# Patient Record
Sex: Male | Born: 2000 | Race: Black or African American | Hispanic: No | Marital: Single | Smoking: Never smoker
Health system: Southern US, Community
[De-identification: ages and names within clinical notes are randomized; demographics above are authoritative.]

## PROBLEM LIST (undated history)

## (undated) DIAGNOSIS — G35 Multiple sclerosis: Secondary | ICD-10-CM

## (undated) DIAGNOSIS — J45909 Unspecified asthma, uncomplicated: Secondary | ICD-10-CM

---
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
## 2021-05-24 ENCOUNTER — Emergency Department (HOSPITAL_COMMUNITY)
Admission: EM | Admit: 2021-05-24 | Discharge: 2021-05-25 | Discharge status: Against Medical Advice | Payer: Medicaid Other | Attending: Emergency Medicine | Admitting: Emergency Medicine

## 2021-05-24 ENCOUNTER — Other Ambulatory Visit: Payer: Self-pay

## 2021-05-24 DIAGNOSIS — S3992XA Unspecified injury of lower back, initial encounter: Secondary | ICD-10-CM | POA: Diagnosis present

## 2021-05-24 DIAGNOSIS — S39012A Strain of muscle, fascia and tendon of lower back, initial encounter: Secondary | ICD-10-CM | POA: Insufficient documentation

## 2021-05-24 DIAGNOSIS — Y92410 Unspecified street and highway as the place of occurrence of the external cause: Secondary | ICD-10-CM | POA: Insufficient documentation
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
## 2021-05-25 ENCOUNTER — Emergency Department (HOSPITAL_COMMUNITY)
Admission: EM | Admit: 2021-05-25 | Discharge: 2021-05-25 | Disposition: A | Discharge status: Home / Self Care | Payer: Medicaid Other | Source: Home / Self Care | Attending: Emergency Medicine | Admitting: Emergency Medicine

## 2021-05-25 ENCOUNTER — Emergency Department (HOSPITAL_COMMUNITY): Payer: Medicaid Other

## 2021-05-25 DIAGNOSIS — Y92410 Unspecified street and highway as the place of occurrence of the external cause: Secondary | ICD-10-CM | POA: Insufficient documentation

## 2021-05-25 DIAGNOSIS — S39012A Strain of muscle, fascia and tendon of lower back, initial encounter: Secondary | ICD-10-CM

## 2021-05-25 MED ORDER — NAPROXEN 375 MG PO TABS: 375.0000 mg | ORAL_TABLET | Freq: Two times a day (BID) | ORAL | 0 refills | Status: DC

## 2021-05-25 MED ORDER — CYCLOBENZAPRINE HCL 10 MG PO TABS: 10.0000 mg | ORAL_TABLET | Freq: Two times a day (BID) | ORAL | 0 refills | Status: DC | PRN

## 2021-05-25 NOTE — ED Notes (Signed)
Pt name called with no answer 

## 2021-05-25 NOTE — Discharge Instructions (Addendum)
Take the medications as needed for pain and discomfort.  Your symptoms should improve over the next week.  Follow-up with your primary care doctor the symptoms do not resolve in the next couple of weeks

## 2021-05-25 NOTE — ED Notes (Signed)
Patient ambulatory to lobby. Discharge paperwork reviewed, patient denies any needs or questions at this time

## 2021-05-25 NOTE — ED Notes (Signed)
Called for repeat VS x3, no response. 
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
## 2021-06-17 ENCOUNTER — Emergency Department (HOSPITAL_COMMUNITY)
Admission: EM | Admit: 2021-06-17 | Discharge: 2021-06-17 | Disposition: A | Discharge status: Home / Self Care | Payer: Medicaid Other | Source: Home / Self Care | Attending: Emergency Medicine | Admitting: Emergency Medicine

## 2021-06-17 ENCOUNTER — Emergency Department (HOSPITAL_COMMUNITY): Payer: Medicaid Other

## 2021-06-17 ENCOUNTER — Encounter (HOSPITAL_COMMUNITY): Payer: Self-pay | Admitting: Emergency Medicine

## 2021-06-17 ENCOUNTER — Other Ambulatory Visit: Payer: Self-pay

## 2021-06-17 ENCOUNTER — Emergency Department (HOSPITAL_COMMUNITY)
Admission: EM | Admit: 2021-06-17 | Discharge: 2021-06-17 | Disposition: A | Discharge status: Home / Self Care | Payer: Medicaid Other | Attending: Emergency Medicine | Admitting: Emergency Medicine

## 2021-06-17 DIAGNOSIS — M79622 Pain in left upper arm: Secondary | ICD-10-CM | POA: Insufficient documentation

## 2021-06-17 DIAGNOSIS — R202 Paresthesia of skin: Secondary | ICD-10-CM | POA: Insufficient documentation

## 2021-06-17 DIAGNOSIS — R200 Anesthesia of skin: Secondary | ICD-10-CM | POA: Diagnosis not present

## 2021-06-17 DIAGNOSIS — J45909 Unspecified asthma, uncomplicated: Secondary | ICD-10-CM | POA: Insufficient documentation

## 2021-06-17 DIAGNOSIS — M79609 Pain in unspecified limb: Secondary | ICD-10-CM

## 2021-06-17 HISTORY — DX: Unspecified asthma, uncomplicated: J45.909

## 2021-06-17 LAB — CBC WITH DIFFERENTIAL/PLATELET
Abs Immature Granulocytes: 0.01 10*3/uL (ref 0.00–0.07)
Basophils Absolute: 0 10*3/uL (ref 0.0–0.1)
Basophils Relative: 1 %
Eosinophils Absolute: 0.1 10*3/uL (ref 0.0–0.5)
Eosinophils Relative: 3 %
HCT: 41.2 % (ref 39.0–52.0)
Hemoglobin: 14.4 g/dL (ref 13.0–17.0)
Immature Granulocytes: 0 %
Lymphocytes Relative: 44 %
Lymphs Abs: 2.1 10*3/uL (ref 0.7–4.0)
MCH: 31.5 pg (ref 26.0–34.0)
MCHC: 35 g/dL (ref 30.0–36.0)
MCV: 90.2 fL (ref 80.0–100.0)
Monocytes Absolute: 0.4 10*3/uL (ref 0.1–1.0)
Monocytes Relative: 8 %
Neutro Abs: 2 10*3/uL (ref 1.7–7.7)
Neutrophils Relative %: 44 %
Platelets: 366 10*3/uL (ref 150–400)
RBC: 4.57 MIL/uL (ref 4.22–5.81)
RDW: 11.8 % (ref 11.5–15.5)
WBC: 4.7 10*3/uL (ref 4.0–10.5)
nRBC: 0 % (ref 0.0–0.2)

## 2021-06-17 LAB — BASIC METABOLIC PANEL
Anion gap: 8 (ref 5–15)
BUN: 10 mg/dL (ref 6–20)
CO2: 24 mmol/L (ref 22–32)
Calcium: 9.3 mg/dL (ref 8.9–10.3)
Chloride: 104 mmol/L (ref 98–111)
Creatinine, Ser: 1.16 mg/dL (ref 0.61–1.24)
GFR, Estimated: >60 mL/min (ref >60)
Glucose, Bld: 105 mg/dL — ABNORMAL HIGH (ref 70–99)
Potassium: 4 mmol/L (ref 3.5–5.1)
Sodium: 136 mmol/L (ref 135–145)
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
## 2021-06-17 NOTE — ED Notes (Signed)
Mri has called asking if the pt is a and o  I went in to see if the pt was awake .  The pt was standing beside the bed with his gow off acting like he was disoriented from sleep  He has removed the pulse ox and his bp cuff and had thrown these items in the floor

## 2021-06-17 NOTE — ED Notes (Signed)
Patient transported to CT 

## 2021-06-17 NOTE — ED Triage Notes (Signed)
T reports left sided weakness on Monday, tinging and numb.  Left arm is weaker and does have a drift.    Provider in traige

## 2021-06-17 NOTE — ED Notes (Signed)
I went into pt's room to check on pt and he was gone and all of his personal belongings were gone.
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
## 2021-06-17 NOTE — ED Notes (Addendum)
Pt asleep not waking up for me to talk with him.  See the triage note

## 2021-06-17 NOTE — ED Triage Notes (Signed)
Patient reports numbness at left arm , left torso and left leg onset 5 days ago , patient added headache . Speech clear /no facial asymmetry , equal strong grips with no arm drift.

## 2021-06-17 NOTE — Discharge Instructions (Signed)
Follow up with neurology in the office.  Return for worsening symptoms

## 2021-06-17 NOTE — ED Notes (Addendum)
Patient transported to MRI 

## 2021-06-17 NOTE — ED Provider Notes (Signed)
Emergency Medicine Provider Triage Evaluation Note  Gregory Barrick , a 20 y.o. male  was evaluated in triage.  Pt complains of left-sided tingling and numbness as well as weakness for the last 6 days.  He states he feels as though it has gotten worse.  He also notes left-sided neck pain.  Review of Systems  Positive: Numbness, tingling, weakness, neck pain Negative: Fever, vomiting, vision changes, neck swelling, trauma  Physical Exam  BP (!) 142/69 (BP Location: Right Arm)   Pulse 64   Temp 98.5 F (36.9 C) (Oral)   Resp 16   Ht 5\' 7"  (1.702 m)   Wt 70 kg   SpO2 100%   BMI 24.17 kg/m  Gen:   Awake, no distress   Resp:  Normal effort  MSK:   Moves extremities without difficulty  Other:  No facial droop, speech deficits, arm drift.  Grip strength equal.  Strength 5/5 in the upper and lower extremities, equal bilaterally.  Medical Decision Making  Medically screening exam initiated at 8:35 PM.  Appropriate orders placed.  Elliot Meldrum was informed that the remainder of the evaluation will be completed by another provider, this initial triage assessment does not replace that evaluation, and the importance of remaining in the ED until their evaluation is complete.     Rowan Blase 06/17/21 2052    2053, MD 06/17/21 2245

## 2021-06-18 ENCOUNTER — Emergency Department (HOSPITAL_COMMUNITY): Payer: Medicaid Other

## 2021-06-18 ENCOUNTER — Emergency Department (HOSPITAL_COMMUNITY)
Admission: EM | Admit: 2021-06-18 | Discharge: 2021-06-18 | Disposition: A | Discharge status: Home / Self Care | Payer: Medicaid Other | Attending: Emergency Medicine | Admitting: Emergency Medicine

## 2021-06-18 ENCOUNTER — Encounter (HOSPITAL_COMMUNITY): Payer: Self-pay | Admitting: Emergency Medicine

## 2021-06-18 ENCOUNTER — Other Ambulatory Visit: Payer: Self-pay

## 2021-06-18 DIAGNOSIS — R202 Paresthesia of skin: Secondary | ICD-10-CM | POA: Diagnosis not present

## 2021-06-18 DIAGNOSIS — J45909 Unspecified asthma, uncomplicated: Secondary | ICD-10-CM | POA: Diagnosis not present

## 2021-06-18 LAB — CBC WITH DIFFERENTIAL/PLATELET
Abs Immature Granulocytes: 0.02 10*3/uL (ref 0.00–0.07)
Basophils Absolute: 0 10*3/uL (ref 0.0–0.1)
Basophils Relative: 1 %
Eosinophils Absolute: 0 10*3/uL (ref 0.0–0.5)
Eosinophils Relative: 1 %
HCT: 45.6 % (ref 39.0–52.0)
Hemoglobin: 16.1 g/dL (ref 13.0–17.0)
Immature Granulocytes: 0 %
Lymphocytes Relative: 24 %
Lymphs Abs: 1.5 10*3/uL (ref 0.7–4.0)
MCH: 31.4 pg (ref 26.0–34.0)
MCHC: 35.3 g/dL (ref 30.0–36.0)
MCV: 88.9 fL (ref 80.0–100.0)
Monocytes Absolute: 0.4 10*3/uL (ref 0.1–1.0)
Monocytes Relative: 6 %
Neutro Abs: 4.3 10*3/uL (ref 1.7–7.7)
Neutrophils Relative %: 68 %
Platelets: 352 10*3/uL (ref 150–400)
RBC: 5.13 MIL/uL (ref 4.22–5.81)
RDW: 11.6 % (ref 11.5–15.5)
WBC: 6.2 10*3/uL (ref 4.0–10.5)
nRBC: 0 % (ref 0.0–0.2)

## 2021-06-18 LAB — BASIC METABOLIC PANEL
Anion gap: 7 (ref 5–15)
BUN: 9 mg/dL (ref 6–20)
CO2: 26 mmol/L (ref 22–32)
Calcium: 9.4 mg/dL (ref 8.9–10.3)
Chloride: 102 mmol/L (ref 98–111)
Creatinine, Ser: 1.01 mg/dL (ref 0.61–1.24)
GFR, Estimated: >60 mL/min (ref >60)
Glucose, Bld: 83 mg/dL (ref 70–99)
Potassium: 4.4 mmol/L (ref 3.5–5.1)
Sodium: 135 mmol/L (ref 135–145)

## 2021-06-18 MED ORDER — GADOBUTROL 1 MMOL/ML IV SOLN
7.0000 mL | Freq: Once | INTRAVENOUS | Status: AC | PRN
  Administered 2021-06-18: 7 mL via INTRAVENOUS

## 2021-06-18 MED ORDER — LORAZEPAM 1 MG PO TABS
1.0000 mg | ORAL_TABLET | Freq: Once | ORAL | Status: AC
  Administered 2021-06-18: 1 mg via ORAL
  Filled 2021-06-18: qty 1
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
## 2021-06-18 NOTE — Discharge Instructions (Signed)
Your evaluated for your numbness/tingling.  We performed an MRV which is an advanced level scan that did not show any abnormalities.  Therefore we feel that you are stable for discharge from the ER.  You should follow-up with your primary care, or use the number above to schedule an appointment with [facility] in the next couple days for reevaluation and further management.  Please return to the ER for any worsening.

## 2021-06-18 NOTE — ED Triage Notes (Signed)
Pt from home left sided numbness since Monday, states woke up this morning and feels nauseated as well.

## 2021-06-18 NOTE — ED Provider Notes (Signed)
This patient was signed out to me by [facility], PA-C.  Briefly, presenting with LUE/LLE and trunk paresthesias preceded by frontal posterior.  Has been seen x2 times earlier this week.  Frontal HA radiating to posteriorly last Monday. Now w/ left-sided and trunk parestehsias. MRI unremarkable couple days back. MRV ordered, per neurology recs. Referral to neurology placed.  Plan: if normal MRV -> home.  Physical Exam  BP 139/72 (BP Location: Right Arm)   Pulse 70   Temp 98 F (36.7 C)   Resp 16   Ht 5\' 7"  (1.702 m)   Wt 70 kg   SpO2 100%   BMI 24.17 kg/m   Physical Exam Vitals and nursing note reviewed.  Constitutional:      General: He is not in acute distress. HENT:     Head: Normocephalic and atraumatic.  Eyes:     Extraocular Movements: Extraocular movements intact.     Conjunctiva/sclera: Conjunctivae normal.  Cardiovascular:     Rate and Rhythm: Normal rate and regular rhythm.  Pulmonary:     Effort: Pulmonary effort is normal. No respiratory distress.  Musculoskeletal:        General: Normal range of motion.     Cervical back: Normal range of motion. No tenderness.  Skin:    General: Skin is warm and dry.  Neurological:     Mental Status: He is alert and oriented to person, place, and time. Mental status is at baseline.  Psychiatric:        Mood and Affect: Mood normal.        Behavior: Behavior normal.    ED Course/Procedures   Clinical Course as of 06/18/21 1703  Sun Jun 18, 2021  1335 Jun 20, 2021, Rec MRV and if neg FU with Neuro outpatient [BH]    Clinical Course User Index [BH] Henderly, Britni A, PA-C    Procedures  MDM  Patient was reevaluated after signout, HDS, no acute concerns.  All studies independently reviewed by myself, d/w the attending physician, factored into my MDM. -MRV unremarkable.  Therefore, feel the patient stable for discharge home with close outpatient follow-up, including neurology referral.  Discuss strict return  precautions.  He understands and agrees to this plan.  Patient is HDS, nontoxic, ambulatory on reevaluation.  Subsequently discharged.  1. Paresthesia       Amada Jupiter, MD 06/18/21 1704    06/20/21, MD 06/18/21 [phone]

## 2021-06-18 NOTE — ED Notes (Signed)
Patient transported to MRI 

## 2021-06-19 ENCOUNTER — Encounter: Payer: Self-pay | Admitting: Neurology

## 2021-08-21 ENCOUNTER — Ambulatory Visit: Payer: Medicaid Other | Admitting: Neurology
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
## 2023-05-17 ENCOUNTER — Other Ambulatory Visit: Payer: Self-pay

## 2023-05-17 ENCOUNTER — Inpatient Hospital Stay (HOSPITAL_COMMUNITY)
Admission: EM | Admit: 2023-05-17 | Discharge: 2023-05-20 | DRG: 060 | Disposition: A | Discharge status: Home / Self Care | Payer: Medicaid Other | Attending: Family Medicine | Admitting: Family Medicine

## 2023-05-17 ENCOUNTER — Emergency Department (HOSPITAL_COMMUNITY): Payer: Medicaid Other

## 2023-05-17 ENCOUNTER — Encounter (HOSPITAL_COMMUNITY): Payer: Self-pay

## 2023-05-17 DIAGNOSIS — Z753 Unavailability and inaccessibility of health-care facilities: Secondary | ICD-10-CM

## 2023-05-17 DIAGNOSIS — E559 Vitamin D deficiency, unspecified: Secondary | ICD-10-CM | POA: Diagnosis not present

## 2023-05-17 DIAGNOSIS — Z560 Unemployment, unspecified: Secondary | ICD-10-CM

## 2023-05-17 DIAGNOSIS — Z91013 Allergy to seafood: Secondary | ICD-10-CM | POA: Diagnosis not present

## 2023-05-17 DIAGNOSIS — R531 Weakness: Secondary | ICD-10-CM | POA: Diagnosis not present

## 2023-05-17 DIAGNOSIS — G35 Multiple sclerosis: Secondary | ICD-10-CM | POA: Diagnosis not present

## 2023-05-17 LAB — BASIC METABOLIC PANEL
Anion gap: 14 (ref 5–15)
BUN: 9 mg/dL (ref 6–20)
CO2: 21 mmol/L — ABNORMAL LOW (ref 22–32)
Calcium: 9.6 mg/dL (ref 8.9–10.3)
Chloride: 102 mmol/L (ref 98–111)
Creatinine, Ser: 1.12 mg/dL (ref 0.61–1.24)
GFR, Estimated: >60 mL/min (ref >60)
Glucose, Bld: 87 mg/dL (ref 70–99)
Potassium: 3.8 mmol/L (ref 3.5–5.1)
Sodium: 137 mmol/L (ref 135–145)

## 2023-05-17 LAB — CBC
HCT: 44.7 % (ref 39.0–52.0)
Hemoglobin: 15.7 g/dL (ref 13.0–17.0)
MCH: 30.3 pg (ref 26.0–34.0)
MCHC: 35.1 g/dL (ref 30.0–36.0)
MCV: 86.3 fL (ref 80.0–100.0)
Platelets: 381 10*3/uL (ref 150–400)
RBC: 5.18 MIL/uL (ref 4.22–5.81)
RDW: 11.6 % (ref 11.5–15.5)
WBC: 4.6 10*3/uL (ref 4.0–10.5)
nRBC: 0 % (ref 0.0–0.2)

## 2023-05-17 MED ORDER — GADOBUTROL 1 MMOL/ML IV SOLN
6.5000 mL | Freq: Once | INTRAVENOUS | Status: AC | PRN
  Administered 2023-05-17: 6.5 mL via INTRAVENOUS

## 2023-05-17 MED ORDER — ENOXAPARIN SODIUM 40 MG/0.4ML IJ SOSY
40.0000 mg | PREFILLED_SYRINGE | INTRAMUSCULAR | Status: DC
  Administered 2023-05-18 – 2023-05-20 (×3): 40 mg via SUBCUTANEOUS
  Filled 2023-05-17 (×3): qty 0.4

## 2023-05-17 MED ORDER — ACETAMINOPHEN 325 MG PO TABS: 650.0000 mg | ORAL_TABLET | Freq: Four times a day (QID) | ORAL | Status: DC | PRN

## 2023-05-17 MED ORDER — SENNOSIDES-DOCUSATE SODIUM 8.6-50 MG PO TABS: 1.0000 | ORAL_TABLET | Freq: Every evening | ORAL | Status: DC | PRN

## 2023-05-17 MED ORDER — ACETAMINOPHEN 650 MG RE SUPP: 650.0000 mg | Freq: Four times a day (QID) | RECTAL | Status: DC | PRN

## 2023-05-17 MED ORDER — SODIUM CHLORIDE 0.9 % IV SOLN
1000.0000 mg | Freq: Every day | INTRAVENOUS | Status: DC
  Administered 2023-05-17: 1000 mg via INTRAVENOUS
  Filled 2023-05-17 (×4): qty 16

## 2023-05-17 NOTE — ED Notes (Signed)
The pt returned from mri 

## 2023-05-17 NOTE — Consult Note (Addendum)
Neurology Consultation Reason for Consult: Suspected MS Referring Physician: Gardner Candle  CC: Right-sided numbness and weakness  History is obtained from: Patient  HPI: Calvin Ward is a 22 y.o. male with a history of a previous episode in 2022 of left-sided numbness and weakness which was evaluated at the time with MRI brain and C-spine, as well as an MR venogram of the head.  At the time, no lesions were clearly evident therefore the patient was advised to follow-up with outpatient neurology.  He states that his symptoms gradually improved over the course of a few months.  2 weeks ago, he began having similar symptoms on his right side this time.  He states that he also had some blurred vision that is since improved.  He describes his right side as being numb, tingly, as well as weak.  Due to the symptoms he sought care in the emergency department again where an MRI of his brain and cervical spine were repeated and this time show clear multifocal lesions consistent with multiple sclerosis.  Past Medical History:  Diagnosis Date   Asthma      History reviewed. No pertinent family history.   Social History:  reports that he has never smoked. He has never used smokeless tobacco. He reports that he does not drink alcohol and does not use drugs.   Exam: Current vital signs: BP 95/60   Pulse 61   Temp 98.3 F (36.8 C)   Resp 16   Ht 5\' 9"  (1.753 m)   Wt 64.4 kg   SpO2 100%   BMI 20.97 kg/m  Vital signs in last 24 hours: Temp:  [98.3 F (36.8 C)-99 F (37.2 C)] 98.3 F (36.8 C) (06/14 2156) Pulse Rate:  [61-75] 61 (06/14 2156) Resp:  [16-17] 16 (06/14 2156) BP: (95-136)/(60-102) 95/60 (06/14 2156) SpO2:  [100 %] 100 % (06/14 2156) Weight:  [64.4 kg] 64.4 kg (06/14 1816)   Physical Exam  Appears well-developed and well-nourished.   Neuro: Mental Status: Patient is awake, alert, oriented to person, place, month, year, and situation. Patient is able to give a clear and  coherent history. No signs of aphasia or neglect Cranial Nerves: II: Visual Fields are full. Pupils are equal, round, and reactive to light.   III,IV, VI: EOMI without ptosis or diploplia.  V: Facial sensation is mildly diminished in the right VII: Facial movement is symmetric.  VIII: hearing is intact to voice X: Uvula elevates symmetrically XI: Shoulder shrug is symmetric. XII: tongue is midline without atrophy or fasciculations.  Motor: He has 4+/5 weakness of the right arm and leg Sensory: Sensation is diminished on the right, he has markedly impaired proprioception Deep Tendon Reflexes: 2+ and symmetric in the brachial radialis and 3+ in the patellae.  Cerebellar: He is ataxic on finger-nose-finger on the right, intact on the left     I have reviewed labs in epic and the results pertinent to this consultation are: CBC and BMP are unremarkable  I have reviewed the images obtained: MRI brain-multifocal lesions, not all of which are enhancing.  MRI cervical spine-lesions consistent with demyelinating disease.  Impression: 22 year old with second clinical episode and MRI demonstrating separation of both time and space, consistent with multiple sclerosis.  Recommendations: 1) Solu-Medrol 1 g daily for 3 to 5 days 2) PT, OT 3) he will need outpatient follow-up to establish care and begin disease modifying therapy. 4) Vitamin D, replete if low  Ritta Slot, MD [facility] [phone]  If 7pm-  7am, please page neurology on call as listed in [place].

## 2023-05-17 NOTE — ED Notes (Signed)
Pharmacy needs to send the iv med  dr Amada Jupiter at  the bedside

## 2023-05-17 NOTE — ED Notes (Signed)
Admitting doctor at the bedside 

## 2023-05-17 NOTE — ED Notes (Signed)
To mri 

## 2023-05-17 NOTE — ED Notes (Signed)
The pt is c/o numbness nad tingling in both his hands  and this is chronicapparently

## 2023-05-17 NOTE — ED Triage Notes (Signed)
Pt came in via POV d/t Rt arm & Rt leg numbness that started 2 weeks ago. Denies pain, feels like he stumbles some & walks harder on his Lt leg more than usual. Denies any recent fall/injuries or any other s/s while in triage.

## 2023-05-17 NOTE — H&P (Signed)
PCP:   Pcp, No   Chief Complaint:  Blurred vision, right-sided weakness and numbness.  HPI: This is a 22 year old male with no significant past medical history.  Per patient approximately 2 weeks ago he woke with both eyes being blurry.  Tingling, weakness and numbness in his right upper and lower extremities.  Per patient he describes this as though he forgot how to walk.  His vision changes resolved after 3 days.  His right-sided numbness and weakness have persisted.  He has a headache, no confusion.  He has had similar episodes in the past  MRI head and C-spine done in the ER reveals: Evidence of multiple sclerosis.  Admission requested.  Review of Systems:  Per HPI.  Past Medical History: Past Medical History:  Diagnosis Date   Asthma    History reviewed. No pertinent surgical history.  Medications: Prior to Admission medications   None   Allergies:   Allergies  Allergen Reactions   Shellfish Allergy     Social History:  reports that he has never smoked. He has never used smokeless tobacco. He reports that he does not drink alcohol and does not use drugs.  Family History: History reviewed. No pertinent family history.  Physical Exam: Vitals:   05/17/23 1816 05/17/23 1817 05/17/23 2156  BP:  (!) 136/102 95/60  Pulse:  75 61  Resp:  17 16  Temp:  99 F (37.2 C) 98.3 F (36.8 C)  TempSrc:  Oral   SpO2:  100% 100%  Weight: 64.4 kg    Height: 5\' 9"  (1.753 m)      General:  Alert and oriented times three, well developed and nourished, no acute distress Eyes: PERRLA, pink conjunctiva, no scleral icterus ENT: Moist oral mucosa, neck supple, no thyromegaly Lungs: clear to ascultation, no wheeze, no crackles, no use of accessory muscles Cardiovascular: regular rate and rhythm, no regurgitation, no gallops, no murmurs. No carotid bruits, no JVD Abdomen: soft, positive BS, non-tender, non-distended, no organomegaly, not an acute abdomen GU: not examined Neuro: CN  II - XII grossly intact, sensation intact Musculoskeletal: strength 5/5 all extremities with right being slightly weaker than the left.  Slightly decreased sensation upper and lower extremities.  Ataxic finger-to-nose to finger on the right. Skin: no rash, no subcutaneous crepitation, no decubitus Psych: appropriate patient   Labs on Admission:  Recent Labs    05/17/23 1953  NA 137  K 3.8  CL 102  CO2 21*  GLUCOSE 87  BUN 9  CREATININE 1.12  CALCIUM 9.6    Recent Labs    05/17/23 1953  WBC 4.6  HGB 15.7  HCT 44.7  MCV 86.3  PLT 381    Radiological Exams on Admission: MR Brain W and Wo Contrast  Result Date: 05/17/2023 CLINICAL DATA:  Right-sided paresthesias, concern for stroke versus multiple sclerosis EXAM: MRI HEAD WITHOUT AND WITH CONTRAST MRI CERVICAL SPINE WITHOUT AND WITH CONTRAST TECHNIQUE: Multiplanar, multiecho pulse sequences of the brain and surrounding structures, and cervical spine, to include the craniocervical junction and cervicothoracic junction, were obtained without and with intravenous contrast. CONTRAST:  6.71mL GADAVIST GADOBUTROL 1 MMOL/ML IV SOLN COMPARISON:  06/17/2021 MRI head and cervical spine FINDINGS: MRI HEAD FINDINGS Brain: Numerous T2 hyperintense lesions in the bilateral periventricular and right juxtacortical white matter, with additional infratentorial lesion in the right cerebellum, which are new from the prior exam; a lesion adjacent to the right occipital horn was likely present on the prior exam. The largest of these  lesions measures 1.8 x 1.6 x 1.5 cm (AP x TR x CC) (series 6, image 24 and series 8, image 80), and is associated with heterogeneous contrast enhancement (series 20, image 37). Other lesions in the bilateral frontal lobes, right parietal lobe, bilateral occipital lobes, bilateral basal ganglia, posterior body and splenium of the corpus callosum, and right cerebellum do not demonstrate associated enhancement. A lesion in the right  frontal white matter (series 2, image 34) has a mild ADC correlate, as does a lesion right splenium of the corpus callosum (series 2, image 26). There is a rim of restricted diffusion with ADC correlate at the anterior aspect of the largest lesion, in the left frontoparietal region (series 2, image 33 and series 250, image 33). No acute hemorrhage, mass, mass effect, or midline shift. No hydrocephalus or extra-axial collection. Normal pituitary and craniocervical junction. No hemosiderin deposition to suggest remote hemorrhage. Normal cerebral volume for age. Vascular: Normal arterial flow voids. Normal arterial and venous enhancement. Skull and upper cervical spine: Normal marrow signal. Sinuses/Orbits: Clear paranasal sinuses. No acute finding in the orbits. MRI CERVICAL SPINE FINDINGS Alignment: No listhesis. Vertebrae: No acute fracture, evidence of discitis, or suspicious osseous lesion. Congenitally short pedicles, which narrow the AP diameter of the spinal canal. Cord: Normal signal and morphology.  No abnormal enhancement. Posterior Fossa, vertebral arteries, paraspinal tissues: Negative. Disc levels: No significant spinal canal stenosis or neural foraminal narrowing. IMPRESSION: 1. Numerous T2 hyperintense lesions in the bilateral cerebral hemispheres and right cerebellum, new from the prior MRI, consistent with multiple sclerosis. The majority of these lesions do not enhance and likely do not represent active demyelination, but the largest lesion, which measures up to 1.8 cm and is in the left frontal parietal white matter, is associated with heterogeneous enhancement and has a rim of restricted diffusion at the anterior aspect, likely an active tumefactive demyelinating lesion. 2. No abnormal signal or enhancement in the cervical spinal cord. 3. Congenitally short pedicles, which narrow the AP diameter of the spinal canal. No significant spinal canal stenosis or neural foraminal narrowing. These results  were called by telephone at the time of interpretation on 05/17/2023 at 9:36 pm to provider BUTLER , who verbally acknowledged these results. Electronically Signed   By: Wiliam Ke M.D.   On: 05/17/2023 21:37   MR Cervical Spine W and Wo Contrast  Result Date: 05/17/2023 CLINICAL DATA:  Right-sided paresthesias, concern for stroke versus multiple sclerosis EXAM: MRI HEAD WITHOUT AND WITH CONTRAST MRI CERVICAL SPINE WITHOUT AND WITH CONTRAST TECHNIQUE: Multiplanar, multiecho pulse sequences of the brain and surrounding structures, and cervical spine, to include the craniocervical junction and cervicothoracic junction, were obtained without and with intravenous contrast. CONTRAST:  6.50mL GADAVIST GADOBUTROL 1 MMOL/ML IV SOLN COMPARISON:  06/17/2021 MRI head and cervical spine FINDINGS: MRI HEAD FINDINGS Brain: Numerous T2 hyperintense lesions in the bilateral periventricular and right juxtacortical white matter, with additional infratentorial lesion in the right cerebellum, which are new from the prior exam; a lesion adjacent to the right occipital horn was likely present on the prior exam. The largest of these lesions measures 1.8 x 1.6 x 1.5 cm (AP x TR x CC) (series 6, image 24 and series 8, image 80), and is associated with heterogeneous contrast enhancement (series 20, image 37). Other lesions in the bilateral frontal lobes, right parietal lobe, bilateral occipital lobes, bilateral basal ganglia, posterior body and splenium of the corpus callosum, and right cerebellum do not demonstrate associated enhancement. A lesion  in the right frontal white matter (series 2, image 34) has a mild ADC correlate, as does a lesion right splenium of the corpus callosum (series 2, image 26). There is a rim of restricted diffusion with ADC correlate at the anterior aspect of the largest lesion, in the left frontoparietal region (series 2, image 33 and series 250, image 33). No acute hemorrhage, mass, mass effect, or midline  shift. No hydrocephalus or extra-axial collection. Normal pituitary and craniocervical junction. No hemosiderin deposition to suggest remote hemorrhage. Normal cerebral volume for age. Vascular: Normal arterial flow voids. Normal arterial and venous enhancement. Skull and upper cervical spine: Normal marrow signal. Sinuses/Orbits: Clear paranasal sinuses. No acute finding in the orbits. MRI CERVICAL SPINE FINDINGS Alignment: No listhesis. Vertebrae: No acute fracture, evidence of discitis, or suspicious osseous lesion. Congenitally short pedicles, which narrow the AP diameter of the spinal canal. Cord: Normal signal and morphology.  No abnormal enhancement. Posterior Fossa, vertebral arteries, paraspinal tissues: Negative. Disc levels: No significant spinal canal stenosis or neural foraminal narrowing. IMPRESSION: 1. Numerous T2 hyperintense lesions in the bilateral cerebral hemispheres and right cerebellum, new from the prior MRI, consistent with multiple sclerosis. The majority of these lesions do not enhance and likely do not represent active demyelination, but the largest lesion, which measures up to 1.8 cm and is in the left frontal parietal white matter, is associated with heterogeneous enhancement and has a rim of restricted diffusion at the anterior aspect, likely an active tumefactive demyelinating lesion. 2. No abnormal signal or enhancement in the cervical spinal cord. 3. Congenitally short pedicles, which narrow the AP diameter of the spinal canal. No significant spinal canal stenosis or neural foraminal narrowing. These results were called by telephone at the time of interpretation on 05/17/2023 at 9:36 pm to provider BUTLER , who verbally acknowledged these results. Electronically Signed   By: Wiliam Ke M.D.   On: 05/17/2023 21:37    Assessment/Plan Present on Admission: Multiple sclerosis -Neurology consult placed -Recommendations Solu-Medrol 1 g IV daily for 3 to 5 days -PT/OT consult  placed   Paloma Grange 05/17/2023, 11:07 PM

## 2023-05-17 NOTE — ED Provider Notes (Signed)
[place] EMERGENCY DEPARTMENT AT [facility] Provider Note   CSN: [id number] Arrival date & time: 05/17/23  1802     History  Chief Complaint  Patient presents with   Rt Arm & Rt Leg numbness    Calvin Ward is a 22 y.o. male.  Patient presents to the emergency department today for evaluation of right upper and lower extremity "numbness" ongoing for the past 2 weeks.  Patient has a documented history of left-sided symptoms which occurred 2 years ago in July.  Patient was seen in the emergency department and had multiple imaging studies.  It sounds like he did not follow-up after discharge.  He cannot remember how long the symptoms lasted.  He does report at that time he was working at [employer] and having frequent headaches.  Patient is not currently working.  He states about 2 weeks ago he developed decreased sensation and tingling in his right foot and ankle area as well as his right hand and arm.  He states that it feels like he "does not know how to use his arm".  Although, his strength seems to be normal and he is ambulatory.  He states that he favors his left leg when he walks because he can feel it more.  He does report haziness in his left visual field about a week ago, however this has improved.  He denies neck pain.  He has not had a headache in some time.  Denies falls or injuries.  No vomiting or confusion.  No fevers or URI symptoms.       Home Medications Prior to Admission medications   Medication Sig Start Date End Date Taking? Authorizing Provider  cetirizine (ZYRTEC) 5 MG chewable tablet Chew 1 tablet (5 mg total) by mouth daily. 04/16/18   Sherrilee Gilles, NP  cyclobenzaprine (FLEXERIL) 10 MG tablet Take 1 tablet (10 mg total) by mouth 2 (two) times daily as needed for muscle spasms. 05/25/21   Linwood Dibbles, MD  fluticasone (FLONASE) 50 MCG/ACT nasal spray Place 1 spray into both nostrils daily. 04/16/18   Sherrilee Gilles, NP  Hyoscyamine Sulfate SL  (LEVSIN/SL) 0.125 MG SUBL Take 1 tablet by mouth 4 (four) times daily as needed. 05/04/19   Mir, Shirlyn Goltz, MD  naproxen (NAPROSYN) 375 MG tablet Take 1 tablet (375 mg total) by mouth 2 (two) times daily. 05/25/21   Linwood Dibbles, MD      Allergies    Shellfish allergy    Review of Systems   Review of Systems  Physical Exam Updated Vital Signs BP (!) 136/102 (BP Location: Left Arm)   Pulse 75   Temp 99 F (37.2 C) (Oral)   Resp 17   Ht 5\' 9"  (1.753 m)   Wt 64.4 kg   SpO2 100%   BMI 20.97 kg/m  Physical Exam Vitals and nursing note reviewed.  Constitutional:      Appearance: He is well-developed.  HENT:     Head: Normocephalic and atraumatic.     Right Ear: Tympanic membrane, ear canal and external ear normal.     Left Ear: Tympanic membrane, ear canal and external ear normal.     Nose: Nose normal.     Mouth/Throat:     Pharynx: Uvula midline.  Eyes:     General: Lids are normal.     Conjunctiva/sclera: Conjunctivae normal.     Pupils: Pupils are equal, round, and reactive to light.  Cardiovascular:     Rate and  Rhythm: Normal rate and regular rhythm.  Pulmonary:     Effort: Pulmonary effort is normal.     Breath sounds: Normal breath sounds.  Abdominal:     Palpations: Abdomen is soft.     Tenderness: There is no abdominal tenderness.  Musculoskeletal:        General: Normal range of motion.     Cervical back: Normal range of motion and neck supple. No tenderness or bony tenderness.  Skin:    General: Skin is warm and dry.  Neurological:     Mental Status: He is alert and oriented to person, place, and time.     GCS: GCS eye subscore is 4. GCS verbal subscore is 5. GCS motor subscore is 6.     Cranial Nerves: No cranial nerve deficit.     Sensory: Sensory deficit (decreased right distal arm and leg) present.     Motor: No abnormal muscle tone.     Coordination: Coordination normal.     Gait: Gait normal.     Comments: Patient is able to stand from a sitting  position and ambulate in the hallway without difficulty objectively.  He fails proprioceptive testing of the right upper extremity and has no difficulty with proprioceptive testing in the left upper extremity.     ED Results / Procedures / Treatments   Labs (all labs ordered are listed, but only abnormal results are displayed) Labs Reviewed  BASIC METABOLIC PANEL - Abnormal; Notable for the following components:      Result Value   CO2 21 (*)    All other components within normal limits  CBC    EKG None  Radiology MR Brain W and Wo Contrast  Result Date: 05/17/2023 CLINICAL DATA:  Right-sided paresthesias, concern for stroke versus multiple sclerosis EXAM: MRI HEAD WITHOUT AND WITH CONTRAST MRI CERVICAL SPINE WITHOUT AND WITH CONTRAST TECHNIQUE: Multiplanar, multiecho pulse sequences of the brain and surrounding structures, and cervical spine, to include the craniocervical junction and cervicothoracic junction, were obtained without and with intravenous contrast. CONTRAST:  6.68mL GADAVIST GADOBUTROL 1 MMOL/ML IV SOLN COMPARISON:  06/17/2021 MRI head and cervical spine FINDINGS: MRI HEAD FINDINGS Brain: Numerous T2 hyperintense lesions in the bilateral periventricular and right juxtacortical white matter, with additional infratentorial lesion in the right cerebellum, which are new from the prior exam; a lesion adjacent to the right occipital horn was likely present on the prior exam. The largest of these lesions measures 1.8 x 1.6 x 1.5 cm (AP x TR x CC) (series 6, image 24 and series 8, image 80), and is associated with heterogeneous contrast enhancement (series 20, image 37). Other lesions in the bilateral frontal lobes, right parietal lobe, bilateral occipital lobes, bilateral basal ganglia, posterior body and splenium of the corpus callosum, and right cerebellum do not demonstrate associated enhancement. A lesion in the right frontal white matter (series 2, image 34) has a mild ADC correlate,  as does a lesion right splenium of the corpus callosum (series 2, image 26). There is a rim of restricted diffusion with ADC correlate at the anterior aspect of the largest lesion, in the left frontoparietal region (series 2, image 33 and series 250, image 33). No acute hemorrhage, mass, mass effect, or midline shift. No hydrocephalus or extra-axial collection. Normal pituitary and craniocervical junction. No hemosiderin deposition to suggest remote hemorrhage. Normal cerebral volume for age. Vascular: Normal arterial flow voids. Normal arterial and venous enhancement. Skull and upper cervical spine: Normal marrow signal. Sinuses/Orbits: Clear paranasal  sinuses. No acute finding in the orbits. MRI CERVICAL SPINE FINDINGS Alignment: No listhesis. Vertebrae: No acute fracture, evidence of discitis, or suspicious osseous lesion. Congenitally short pedicles, which narrow the AP diameter of the spinal canal. Cord: Normal signal and morphology.  No abnormal enhancement. Posterior Fossa, vertebral arteries, paraspinal tissues: Negative. Disc levels: No significant spinal canal stenosis or neural foraminal narrowing. IMPRESSION: 1. Numerous T2 hyperintense lesions in the bilateral cerebral hemispheres and right cerebellum, new from the prior MRI, consistent with multiple sclerosis. The majority of these lesions do not enhance and likely do not represent active demyelination, but the largest lesion, which measures up to 1.8 cm and is in the left frontal parietal white matter, is associated with heterogeneous enhancement and has a rim of restricted diffusion at the anterior aspect, likely an active tumefactive demyelinating lesion. 2. No abnormal signal or enhancement in the cervical spinal cord. 3. Congenitally short pedicles, which narrow the AP diameter of the spinal canal. No significant spinal canal stenosis or neural foraminal narrowing. These results were called by telephone at the time of interpretation on 05/17/2023  at 9:36 pm to provider BUTLER , who verbally acknowledged these results. Electronically Signed   By: Wiliam Ke M.D.   On: 05/17/2023 21:37   MR Cervical Spine W and Wo Contrast  Result Date: 05/17/2023 CLINICAL DATA:  Right-sided paresthesias, concern for stroke versus multiple sclerosis EXAM: MRI HEAD WITHOUT AND WITH CONTRAST MRI CERVICAL SPINE WITHOUT AND WITH CONTRAST TECHNIQUE: Multiplanar, multiecho pulse sequences of the brain and surrounding structures, and cervical spine, to include the craniocervical junction and cervicothoracic junction, were obtained without and with intravenous contrast. CONTRAST:  6.1mL GADAVIST GADOBUTROL 1 MMOL/ML IV SOLN COMPARISON:  06/17/2021 MRI head and cervical spine FINDINGS: MRI HEAD FINDINGS Brain: Numerous T2 hyperintense lesions in the bilateral periventricular and right juxtacortical white matter, with additional infratentorial lesion in the right cerebellum, which are new from the prior exam; a lesion adjacent to the right occipital horn was likely present on the prior exam. The largest of these lesions measures 1.8 x 1.6 x 1.5 cm (AP x TR x CC) (series 6, image 24 and series 8, image 80), and is associated with heterogeneous contrast enhancement (series 20, image 37). Other lesions in the bilateral frontal lobes, right parietal lobe, bilateral occipital lobes, bilateral basal ganglia, posterior body and splenium of the corpus callosum, and right cerebellum do not demonstrate associated enhancement. A lesion in the right frontal white matter (series 2, image 34) has a mild ADC correlate, as does a lesion right splenium of the corpus callosum (series 2, image 26). There is a rim of restricted diffusion with ADC correlate at the anterior aspect of the largest lesion, in the left frontoparietal region (series 2, image 33 and series 250, image 33). No acute hemorrhage, mass, mass effect, or midline shift. No hydrocephalus or extra-axial collection. Normal pituitary  and craniocervical junction. No hemosiderin deposition to suggest remote hemorrhage. Normal cerebral volume for age. Vascular: Normal arterial flow voids. Normal arterial and venous enhancement. Skull and upper cervical spine: Normal marrow signal. Sinuses/Orbits: Clear paranasal sinuses. No acute finding in the orbits. MRI CERVICAL SPINE FINDINGS Alignment: No listhesis. Vertebrae: No acute fracture, evidence of discitis, or suspicious osseous lesion. Congenitally short pedicles, which narrow the AP diameter of the spinal canal. Cord: Normal signal and morphology.  No abnormal enhancement. Posterior Fossa, vertebral arteries, paraspinal tissues: Negative. Disc levels: No significant spinal canal stenosis or neural foraminal narrowing. IMPRESSION: 1. Numerous  T2 hyperintense lesions in the bilateral cerebral hemispheres and right cerebellum, new from the prior MRI, consistent with multiple sclerosis. The majority of these lesions do not enhance and likely do not represent active demyelination, but the largest lesion, which measures up to 1.8 cm and is in the left frontal parietal white matter, is associated with heterogeneous enhancement and has a rim of restricted diffusion at the anterior aspect, likely an active tumefactive demyelinating lesion. 2. No abnormal signal or enhancement in the cervical spinal cord. 3. Congenitally short pedicles, which narrow the AP diameter of the spinal canal. No significant spinal canal stenosis or neural foraminal narrowing. These results were called by telephone at the time of interpretation on 05/17/2023 at 9:36 pm to provider BUTLER , who verbally acknowledged these results. Electronically Signed   By: Wiliam Ke M.D.   On: 05/17/2023 21:37    Procedures Procedures    Medications Ordered in ED Medications - No data to display  ED Course/ Medical Decision Making/ A&P Clinical Course as of 05/17/23 2232  Fri May 17, 2023  [id number] 22 year old male here with increased  clumsiness possible weakness of his right arm right leg.  Had similar workup few years ago that was negative.  Here had an MRI that was positive for enhancing lesions consistent with MS.  Neurology consulting on him and is recommending admission.  Discussed with Dr. Joneen Roach [facility] hospitalist who will evaluate patient for admission. [MB]    Clinical Course User Index [MB] Terrilee Files, MD    Patient seen and examined. History obtained directly from patient.  I reviewed patient's previous ED notes and emergency department imaging studies from July 2022.  Labs/EKG: None ordered  Imaging: Ordered MRI brain and cervical spine with and without contrast  Medications/Fluids: None ordered  Most recent vital signs reviewed and are as follows: BP (!) 136/102 (BP Location: Left Arm)   Pulse 75   Temp 99 F (37.2 C) (Oral)   Resp 17   Ht 5\' 9"  (1.753 m)   Wt 64.4 kg   SpO2 100%   BMI 20.97 kg/m   Initial impression: Recurrent paresthesias, however different location than previous.  Discussed with on-call neurohospitalist Dr. Amada Jupiter.  Patient's social determinants of health affected by poor access to healthcare.  I am concerned that he would be unable to follow-up.  Will obtain MRI brain and cervical spine in order to evaluate for possibility of multiple sclerosis.  If imaging is negative, will discharge to home with outpatient referrals.   10:32 PM Reassessment performed. Patient appears stable during a couple of rechecks.  Sitting comfortably on the side of the bed.  Labs personally reviewed and interpreted including: CBC unremarkable; BMP unremarkable.  Imaging personally visualized and interpreted including: MRI of the brain.  Called by radiologist due to lesions concerning for multiple sclerosis.  I have consulted with Dr. Amada Jupiter who will see the patient.  He advises admission for high-dose steroids and continued management.  Plan to admit to hospitalist service.  Reviewed  pertinent lab work and imaging with patient at bedside. Questions answered.   Most current vital signs reviewed and are as follows: BP 95/60   Pulse 61   Temp 98.3 F (36.8 C)   Resp 16   Ht 5\' 9"  (1.753 m)   Wt 64.4 kg   SpO2 100%   BMI 20.97 kg/m   Plan: Admit to hospital.   Dr. Joneen Roach of [facility] hospitalist will see.  Medical Decision Making Amount and/or Complexity of Data Reviewed Labs: ordered. Radiology: ordered.  Risk Prescription drug management. Decision regarding hospitalization.   Patient with right-sided numbness and workup concerning for new diagnosis of multiple sclerosis.  He will need admission for high-dose steroids and to arrange appropriate outpatient follow-up.        Final Clinical Impression(s) / ED Diagnoses Final diagnoses:  Multiple sclerosis [facility])    Rx / DC Orders ED Discharge Orders     None         Renne Crigler, Cordelia Poche 05/17/23 2235    Terrilee Files, MD 05/18/23 1054

## 2023-05-17 NOTE — ED Notes (Signed)
ED TO INPATIENT HANDOFF REPORT  ED Nurse Name and Phone #: [phone]   S Name/Age/Gender Calvin Ward 22 y.o. male Room/Bed: H020C/H020C  Code Status   Code Status: Full Code  Home/SNF/Other Home Patient oriented to: self, place, time, and situation Is this baseline? Yes   Triage Complete: Triage complete  Chief Complaint Multiple sclerosis (HCC) [G35]  Triage Note Pt came in via POV d/t Rt arm & Rt leg numbness that started 2 weeks ago. Denies pain, feels like he stumbles some & walks harder on his Lt leg more than usual. Denies any recent fall/injuries or any other s/s while in triage.   Allergies Allergies  Allergen Reactions   Shellfish Allergy     Level of Care/Admitting Diagnosis ED Disposition     ED Disposition  Admit   Condition  --   Comment  Hospital Area: [facility] [[id number]]  Level of Care: Med-Surg [16]  May admit patient to [facility] or [facility] if equivalent level of care is available:: No  Covid Evaluation: Confirmed COVID Negative  Diagnosis: Multiple sclerosis (HCC) [340.ICD-9-CM]  Admitting Physician: Gery Pray [[id number]]  Attending Physician: Gery Pray [phone]  Certification:: I certify this patient will need inpatient services for at least 2 midnights  Estimated Length of Stay: 2          B Medical/Surgery History Past Medical History:  Diagnosis Date   Asthma    History reviewed. No pertinent surgical history.   A IV Location/Drains/Wounds Patient Lines/Drains/Airways Status     Active Line/Drains/Airways     Name Placement date Placement time Site Days   Peripheral IV 05/17/23 20 G Right Antecubital 05/17/23  1935  Antecubital  less than 1            Intake/Output Last 24 hours No intake or output data in the 24 hours ending 05/17/23 2343  Labs/Imaging Results for orders placed or performed during the hospital encounter of 05/17/23 (from the past 48 hour(s))  CBC     Status: None    Collection Time: 05/17/23  7:53 PM  Result Value Ref Range   WBC 4.6 4.0 - 10.5 K/uL   RBC 5.18 4.22 - 5.81 MIL/uL   Hemoglobin 15.7 13.0 - 17.0 g/dL   HCT 11.9 14.7 - 82.9 %   MCV 86.3 80.0 - 100.0 fL   MCH 30.3 26.0 - 34.0 pg   MCHC 35.1 30.0 - 36.0 g/dL   RDW 56.2 13.0 - 86.5 %   Platelets 381 150 - 400 K/uL   nRBC 0.0 0.0 - 0.2 %    Comment: Performed at [facility], [address] [place], [place] [address]  Basic metabolic panel     Status: Abnormal   Collection Time: 05/17/23  7:53 PM  Result Value Ref Range   Sodium 137 135 - 145 mmol/L   Potassium 3.8 3.5 - 5.1 mmol/L   Chloride 102 98 - 111 mmol/L   CO2 21 (L) 22 - 32 mmol/L   Glucose, Bld 87 70 - 99 mg/dL    Comment: Glucose reference range applies only to samples taken after fasting for at least 8 hours.   BUN 9 6 - 20 mg/dL   Creatinine, Ser 6.29 0.61 - 1.24 mg/dL   Calcium 9.6 8.9 - 52.8 mg/dL   GFR, Estimated >41 >32 mL/min    Comment: (NOTE) Calculated using the CKD-EPI Creatinine Equation (2021)    Anion gap 14 5 - 15  Comment: Performed at [facility], [address] [place], [place] [address]   MR Brain W and Wo Contrast  Result Date: 05/17/2023 CLINICAL DATA:  Right-sided paresthesias, concern for stroke versus multiple sclerosis EXAM: MRI HEAD WITHOUT AND WITH CONTRAST MRI CERVICAL SPINE WITHOUT AND WITH CONTRAST TECHNIQUE: Multiplanar, multiecho pulse sequences of the brain and surrounding structures, and cervical spine, to include the craniocervical junction and cervicothoracic junction, were obtained without and with intravenous contrast. CONTRAST:  6.74mL GADAVIST GADOBUTROL 1 MMOL/ML IV SOLN COMPARISON:  06/17/2021 MRI head and cervical spine FINDINGS: MRI HEAD FINDINGS Brain: Numerous T2 hyperintense lesions in the bilateral periventricular and right juxtacortical white matter, with additional infratentorial lesion in the right cerebellum, which are new from the prior exam; a lesion  adjacent to the right occipital horn was likely present on the prior exam. The largest of these lesions measures 1.8 x 1.6 x 1.5 cm (AP x TR x CC) (series 6, image 24 and series 8, image 80), and is associated with heterogeneous contrast enhancement (series 20, image 37). Other lesions in the bilateral frontal lobes, right parietal lobe, bilateral occipital lobes, bilateral basal ganglia, posterior body and splenium of the corpus callosum, and right cerebellum do not demonstrate associated enhancement. A lesion in the right frontal white matter (series 2, image 34) has a mild ADC correlate, as does a lesion right splenium of the corpus callosum (series 2, image 26). There is a rim of restricted diffusion with ADC correlate at the anterior aspect of the largest lesion, in the left frontoparietal region (series 2, image 33 and series 250, image 33). No acute hemorrhage, mass, mass effect, or midline shift. No hydrocephalus or extra-axial collection. Normal pituitary and craniocervical junction. No hemosiderin deposition to suggest remote hemorrhage. Normal cerebral volume for age. Vascular: Normal arterial flow voids. Normal arterial and venous enhancement. Skull and upper cervical spine: Normal marrow signal. Sinuses/Orbits: Clear paranasal sinuses. No acute finding in the orbits. MRI CERVICAL SPINE FINDINGS Alignment: No listhesis. Vertebrae: No acute fracture, evidence of discitis, or suspicious osseous lesion. Congenitally short pedicles, which narrow the AP diameter of the spinal canal. Cord: Normal signal and morphology.  No abnormal enhancement. Posterior Fossa, vertebral arteries, paraspinal tissues: Negative. Disc levels: No significant spinal canal stenosis or neural foraminal narrowing. IMPRESSION: 1. Numerous T2 hyperintense lesions in the bilateral cerebral hemispheres and right cerebellum, new from the prior MRI, consistent with multiple sclerosis. The majority of these lesions do not enhance and likely  do not represent active demyelination, but the largest lesion, which measures up to 1.8 cm and is in the left frontal parietal white matter, is associated with heterogeneous enhancement and has a rim of restricted diffusion at the anterior aspect, likely an active tumefactive demyelinating lesion. 2. No abnormal signal or enhancement in the cervical spinal cord. 3. Congenitally short pedicles, which narrow the AP diameter of the spinal canal. No significant spinal canal stenosis or neural foraminal narrowing. These results were called by telephone at the time of interpretation on 05/17/2023 at 9:36 pm to provider BUTLER , who verbally acknowledged these results. Electronically Signed   By: Wiliam Ke M.D.   On: 05/17/2023 21:37   MR Cervical Spine W and Wo Contrast  Result Date: 05/17/2023 CLINICAL DATA:  Right-sided paresthesias, concern for stroke versus multiple sclerosis EXAM: MRI HEAD WITHOUT AND WITH CONTRAST MRI CERVICAL SPINE WITHOUT AND WITH CONTRAST TECHNIQUE: Multiplanar, multiecho pulse sequences of the brain and surrounding structures, and cervical spine, to include  the craniocervical junction and cervicothoracic junction, were obtained without and with intravenous contrast. CONTRAST:  6.23mL GADAVIST GADOBUTROL 1 MMOL/ML IV SOLN COMPARISON:  06/17/2021 MRI head and cervical spine FINDINGS: MRI HEAD FINDINGS Brain: Numerous T2 hyperintense lesions in the bilateral periventricular and right juxtacortical white matter, with additional infratentorial lesion in the right cerebellum, which are new from the prior exam; a lesion adjacent to the right occipital horn was likely present on the prior exam. The largest of these lesions measures 1.8 x 1.6 x 1.5 cm (AP x TR x CC) (series 6, image 24 and series 8, image 80), and is associated with heterogeneous contrast enhancement (series 20, image 37). Other lesions in the bilateral frontal lobes, right parietal lobe, bilateral occipital lobes, bilateral basal  ganglia, posterior body and splenium of the corpus callosum, and right cerebellum do not demonstrate associated enhancement. A lesion in the right frontal white matter (series 2, image 34) has a mild ADC correlate, as does a lesion right splenium of the corpus callosum (series 2, image 26). There is a rim of restricted diffusion with ADC correlate at the anterior aspect of the largest lesion, in the left frontoparietal region (series 2, image 33 and series 250, image 33). No acute hemorrhage, mass, mass effect, or midline shift. No hydrocephalus or extra-axial collection. Normal pituitary and craniocervical junction. No hemosiderin deposition to suggest remote hemorrhage. Normal cerebral volume for age. Vascular: Normal arterial flow voids. Normal arterial and venous enhancement. Skull and upper cervical spine: Normal marrow signal. Sinuses/Orbits: Clear paranasal sinuses. No acute finding in the orbits. MRI CERVICAL SPINE FINDINGS Alignment: No listhesis. Vertebrae: No acute fracture, evidence of discitis, or suspicious osseous lesion. Congenitally short pedicles, which narrow the AP diameter of the spinal canal. Cord: Normal signal and morphology.  No abnormal enhancement. Posterior Fossa, vertebral arteries, paraspinal tissues: Negative. Disc levels: No significant spinal canal stenosis or neural foraminal narrowing. IMPRESSION: 1. Numerous T2 hyperintense lesions in the bilateral cerebral hemispheres and right cerebellum, new from the prior MRI, consistent with multiple sclerosis. The majority of these lesions do not enhance and likely do not represent active demyelination, but the largest lesion, which measures up to 1.8 cm and is in the left frontal parietal white matter, is associated with heterogeneous enhancement and has a rim of restricted diffusion at the anterior aspect, likely an active tumefactive demyelinating lesion. 2. No abnormal signal or enhancement in the cervical spinal cord. 3. Congenitally  short pedicles, which narrow the AP diameter of the spinal canal. No significant spinal canal stenosis or neural foraminal narrowing. These results were called by telephone at the time of interpretation on 05/17/2023 at 9:36 pm to provider BUTLER , who verbally acknowledged these results. Electronically Signed   By: Wiliam Ke M.D.   On: 05/17/2023 21:37    Pending Labs Unresulted Labs (From admission, onward)     Start     Ordered   05/24/23 0500  Creatinine, serum  (enoxaparin (LOVENOX)    CrCl >/= 30 ml/min)  Weekly,   R     Comments: while on enoxaparin therapy    05/17/23 2324   05/18/23 0500  Basic metabolic panel  Tomorrow morning,   R        05/17/23 2324   05/18/23 0500  CBC with Differential/Platelet  Tomorrow morning,   R        05/17/23 2324   05/17/23 2324  HIV Antibody (routine testing w rflx)  (HIV Antibody (Routine testing w reflex) panel)  Once,   R        05/17/23 2324   05/17/23 2249  VITAMIN D 25 Hydroxy (Vit-D Deficiency, Fractures)  Once,   URGENT        05/17/23 2248            Vitals/Pain Today's Vitals   05/17/23 1816 05/17/23 1817 05/17/23 2156  BP:  (!) 136/102 95/60  Pulse:  75 61  Resp:  17 16  Temp:  99 F (37.2 C) 98.3 F (36.8 C)  TempSrc:  Oral   SpO2:  100% 100%  Weight: 64.4 kg    Height: 5\' 9"  (1.753 m)    PainSc: 0-No pain      Isolation Precautions No active isolations  Medications Medications  methylPREDNISolone sodium succinate (SOLU-MEDROL) 1,000 mg in sodium chloride 0.9 % 50 mL IVPB (0 mg Intravenous Stopped 05/17/23 2340)  enoxaparin (LOVENOX) injection 40 mg (has no administration in time range)  acetaminophen (TYLENOL) tablet 650 mg (has no administration in time range)    Or  acetaminophen (TYLENOL) suppository 650 mg (has no administration in time range)  senna-docusate (Senokot-S) tablet 1 tablet (has no administration in time range)  gadobutrol (GADAVIST) 1 MMOL/ML injection 6.5 mL (6.5 mLs Intravenous Contrast  Given 05/17/23 2050)    Mobility walks     Focused Assessments Neuro Assessment Handoff:  Swallow screen pass? No          Neuro Assessment:   Neuro Checks:      Has TPA been given? No If patient is a Neuro Trauma and patient is going to OR before floor call report to 4N Charge nurse: [phone] or [phone]   R Recommendations: See Admitting Provider Note  Report given to:   Additional Notes:

## 2023-05-18 DIAGNOSIS — E559 Vitamin D deficiency, unspecified: Secondary | ICD-10-CM

## 2023-05-18 DIAGNOSIS — G35 Multiple sclerosis: Secondary | ICD-10-CM | POA: Diagnosis not present

## 2023-05-18 LAB — CBC WITH DIFFERENTIAL/PLATELET
Abs Immature Granulocytes: 0 10*3/uL (ref 0.00–0.07)
Basophils Absolute: 0.1 10*3/uL (ref 0.0–0.1)
Basophils Relative: 1 %
Eosinophils Absolute: 0.2 10*3/uL (ref 0.0–0.5)
Eosinophils Relative: 5 %
HCT: 48.3 % (ref 39.0–52.0)
Hemoglobin: 16.8 g/dL (ref 13.0–17.0)
Immature Granulocytes: 0 %
Lymphocytes Relative: 33 %
Lymphs Abs: 1.2 10*3/uL (ref 0.7–4.0)
MCH: 30.2 pg (ref 26.0–34.0)
MCHC: 34.8 g/dL (ref 30.0–36.0)
MCV: 86.7 fL (ref 80.0–100.0)
Monocytes Absolute: 0.2 10*3/uL (ref 0.1–1.0)
Monocytes Relative: 5 %
Neutro Abs: 2.1 10*3/uL (ref 1.7–7.7)
Neutrophils Relative %: 56 %
Platelets: 374 10*3/uL (ref 150–400)
RBC: 5.57 MIL/uL (ref 4.22–5.81)
RDW: 11.3 % — ABNORMAL LOW (ref 11.5–15.5)
WBC: 3.8 10*3/uL — ABNORMAL LOW (ref 4.0–10.5)
nRBC: 0 % (ref 0.0–0.2)

## 2023-05-18 LAB — VITAMIN D 25 HYDROXY (VIT D DEFICIENCY, FRACTURES): Vit D, 25-Hydroxy: 10.5 ng/mL — ABNORMAL LOW (ref 30–100)

## 2023-05-18 LAB — HEPATITIS PANEL, ACUTE
HCV Ab: NONREACTIVE
Hep A IgM: NONREACTIVE
Hep B C IgM: NONREACTIVE
Hepatitis B Surface Ag: NONREACTIVE

## 2023-05-18 LAB — BASIC METABOLIC PANEL
Anion gap: 14 (ref 5–15)
BUN: 9 mg/dL (ref 6–20)
CO2: 22 mmol/L (ref 22–32)
Calcium: 9.5 mg/dL (ref 8.9–10.3)
Chloride: 101 mmol/L (ref 98–111)
Creatinine, Ser: 1.05 mg/dL (ref 0.61–1.24)
GFR, Estimated: >60 mL/min (ref >60)
Glucose, Bld: 89 mg/dL (ref 70–99)
Potassium: 3.7 mmol/L (ref 3.5–5.1)
Sodium: 137 mmol/L (ref 135–145)

## 2023-05-18 LAB — HEPATIC FUNCTION PANEL
ALT: 22 U/L (ref 0–44)
AST: 25 U/L (ref 15–41)
Albumin: 4.3 g/dL (ref 3.5–5.0)
Alkaline Phosphatase: 89 U/L (ref 38–126)
Bilirubin, Direct: 0.1 mg/dL (ref 0.0–0.2)
Indirect Bilirubin: 0.9 mg/dL (ref 0.3–0.9)
Total Bilirubin: 1 mg/dL (ref 0.3–1.2)
Total Protein: 8.1 g/dL (ref 6.5–8.1)

## 2023-05-18 LAB — HIV ANTIBODY (ROUTINE TESTING W REFLEX): HIV Screen 4th Generation wRfx: NONREACTIVE

## 2023-05-18 MED ORDER — SODIUM CHLORIDE 0.9 % IV SOLN
1000.0000 mg | Freq: Every day | INTRAVENOUS | Status: DC
  Administered 2023-05-18: 1000 mg via INTRAVENOUS
  Filled 2023-05-18 (×2): qty 16

## 2023-05-18 MED ORDER — PANTOPRAZOLE SODIUM 20 MG PO TBEC
20.0000 mg | DELAYED_RELEASE_TABLET | Freq: Every day | ORAL | Status: DC
  Administered 2023-05-18 – 2023-05-20 (×3): 20 mg via ORAL
  Filled 2023-05-18 (×3): qty 1

## 2023-05-18 MED ORDER — VITAMIN D 25 MCG (1000 UNIT) PO TABS
1000.0000 [IU] | ORAL_TABLET | Freq: Every day | ORAL | Status: DC
  Administered 2023-05-18 – 2023-05-20 (×3): 1000 [IU] via ORAL
  Filled 2023-05-18 (×3): qty 1

## 2023-05-18 NOTE — Progress Notes (Signed)
  Progress Note   Patient: Calvin Ward VHQ:[id number] DOB: Apr 15, 2001 DOA: 05/17/2023     1 DOS: the patient was seen and examined on 05/18/2023 at 10:28 AM      Brief hospital course: Mr. Cort is a 22 y.o. M with a history of previous spontaneous numbness and weakness which resolved itself (in 2022) who now presents with 2 weks right sided weakness, numbness and blurred vision.  In the ER, MRI brain showed multifocal hyperintensities, consistent with MS.  Admitted for Solu-medrol.     Assessment and Plan: * Multiple sclerosis (HCC) - Solu-Medrol 1 g daily, day 2 of 5 - CBGs daily and PPI while on Solu-Medrol  - Consult neurology, appreciate expertise    Vitamin D deficiency - Start vitamin D supplement          Subjective: Patient still has right-sided numbness, weakness.  No significant change.  No other focal weakness, numbness, vision changes.     Physical Exam: BP (!) 140/85 (BP Location: Right Arm)   Pulse 96   Temp 98.7 F (37.1 C) (Oral)   Resp 18   Ht 5' 9.02" (1.753 m)   Wt 66.4 kg   SpO2 100%   BMI 21.61 kg/m   Adult male, lying in bed, no acute distress RRR, no murmurs, no peripheral edema Respiratory rate normal, lungs clear without rales or wheezes His motor strength is slightly diminished on the right side, he has diminished sensation on the right hand    Data Reviewed: CBC and BMP normal Vitamin D level low     Disposition: Status is: Inpatient         Author: Alberteen Sam, MD 05/18/2023 3:02 PM  For on call review [email or web address].

## 2023-05-18 NOTE — Evaluation (Signed)
Physical Therapy Evaluation Patient Details Name: Calvin Ward MRN: [id number] DOB: 05-11-2001 Today's Date: 05/18/2023  History of Present Illness  Pt is a 22 y.o. M who presents 05/17/2023 with 2 weeks of blurred vision, tingling/weakness/numbness of right extremities. MRI head and C spine done in the ER reveals evidence of clear multifocal lesions consistent with multiple sclerosis. Significant PMH: asthma.   Clinical Impression  PTA pt independent with all functional mobility and ADL's, lives at home with mother currently. Today pt with supervision for safety with all mobility, amb 250' and completes 4 stairs. Pt scored a 19/24 on DGI assessment, notable difficulties with changing gait speed, vertical head turns, pivot turning, and obstacle clearance. Pt w decreased knee and hip flexion during gait, relying on swinging the leg forward to progress steps but with no LOB. Pt reports they don't feel like they know how to work their legs right now, hence the deviation. PT educated pt on monitoring during acute exacerbations of their symptoms, activity and heat tolerance, and general exercise recommendations moving forward. Pt's acute PT needs have been met at this time, PT recommending OP neuro PT pending discharge.     Recommendations for follow up therapy are one component of a multi-disciplinary discharge planning process, led by the attending physician.  Recommendations may be updated based on patient status, additional functional criteria and insurance authorization.  Follow Up Recommendations       Assistance Recommended at Discharge PRN  Patient can return home with the following  Assist for transportation    Equipment Recommendations None recommended by PT  Recommendations for Other Services       Functional Status Assessment Patient has had a recent decline in their functional status and demonstrates the ability to make significant improvements in function in a reasonable and  predictable amount of time.     Precautions / Restrictions Precautions Precautions: None Restrictions Weight Bearing Restrictions: No      Mobility  Bed Mobility Overal bed mobility: Independent                  Transfers Overall transfer level: Independent Equipment used: None                    Ambulation/Gait Ambulation/Gait assistance: Supervision Gait Distance (Feet): 250 Feet Assistive device: None Gait Pattern/deviations: Step-through pattern   Gait velocity interpretation: >4.37 ft/sec, indicative of normal walking speed   General Gait Details: Pt quick speed, decreased knee flexion during swing phase bilat, minor lateral sway  Stairs Stairs: Yes Stairs assistance: Supervision Stair Management: One rail Left, Alternating pattern Number of Stairs: 4 General stair comments: Alternating pattern w ascent and descent, uses L hand rail  Wheelchair Mobility    Modified Rankin (Stroke Patients Only)       Balance Overall balance assessment: Independent                               Standardized Balance Assessment Standardized Balance Assessment : Dynamic Gait Index   Dynamic Gait Index Level Surface: Normal Change in Gait Speed: Mild Impairment Gait with Horizontal Head Turns: Normal Gait with Vertical Head Turns: Mild Impairment Gait and Pivot Turn: Mild Impairment Step Over Obstacle: Mild Impairment Step Around Obstacles: Mild Impairment Steps: Normal Total Score: 19       Pertinent Vitals/Pain Pain Assessment Pain Assessment: No/denies pain    Home Living Family/patient expects to be discharged to:: Private residence Living  Arrangements: Parent Available Help at Discharge: Family Type of Home: House Home Access: Level entry       Home Layout: One level Home Equipment: None Additional Comments: mom works during the day    Prior Function Prior Level of Function : Independent/Modified Independent                ADLs Comments: not working or driving, was working at [employer] about a year ago     Higher education careers adviser   Dominant Hand: Right    Extremity/Trunk Assessment   Upper Extremity Assessment Upper Extremity Assessment: RUE deficits/detail RUE Deficits / Details: strength 4+/5 but decreased FMC/GMC, decreased sensation RUE Sensation: decreased light touch;decreased proprioception RUE Coordination: decreased fine motor;decreased gross motor    Lower Extremity Assessment Lower Extremity Assessment: RLE deficits/detail;LLE deficits/detail RLE Coordination: WNL LLE Deficits / Details: Heel to shin with no increased time bilater. LLE Coordination: WNL    Cervical / Trunk Assessment Cervical / Trunk Assessment: Normal  Communication   Communication: No difficulties  Cognition Arousal/Alertness: Awake/alert Behavior During Therapy: WFL for tasks assessed/performed Overall Cognitive Status: Within Functional Limits for tasks assessed                                 General Comments: appears WFL for tasks assessed, no formal assessment completed        General Comments General comments (skin integrity, edema, etc.): Provided ed on activity and heat tolerance, knowing when to check w self for symptoms of exacerbation, taking rest breaks.    Exercises     Assessment/Plan    PT Assessment All further PT needs can be met in the next venue of care  PT Problem List Impaired sensation;Decreased knowledge of precautions       PT Treatment Interventions      PT Goals (Current goals can be found in the Care Plan section)  Acute Rehab PT Goals Patient Stated Goal: ready to be back to normal PT Goal Formulation: With patient Time For Goal Achievement: 06/01/23 Potential to Achieve Goals: Good    Frequency       Co-evaluation               AM-PAC PT "6 Clicks" Mobility  Outcome Measure Help needed turning from your back to your side while in a flat bed  without using bedrails?: None Help needed moving from lying on your back to sitting on the side of a flat bed without using bedrails?: None Help needed moving to and from a bed to a chair (including a wheelchair)?: None Help needed standing up from a chair using your arms (e.g., wheelchair or bedside chair)?: None Help needed to walk in hospital room?: None Help needed climbing 3-5 steps with a railing? : None 6 Click Score: 24    End of Session Equipment Utilized During Treatment: Gait belt Activity Tolerance: Patient tolerated treatment well Patient left: in bed;with call bell/phone within reach;with bed alarm set Nurse Communication: Mobility status PT Visit Diagnosis: Other abnormalities of gait and mobility (R26.89);Other symptoms and signs involving the nervous system (R29.898);Difficulty in walking, not elsewhere classified (R26.2)    Time: 1610-9604 PT Time Calculation (min) (ACUTE ONLY): 15 min   Charges:   PT Evaluation $PT Eval Low Complexity: 1 Low          Hendricks Milo, SPT  Acute Rehabilitation Services   Hendricks Milo 05/18/2023, 4:46 PM

## 2023-05-18 NOTE — Evaluation (Addendum)
Occupational Therapy Evaluation Patient Details Name: Calvin Ward MRN: [id number] DOB: [birth year] Today's Date: 05/18/2023   History of Present Illness Pt is a 22 y.o. M who presents 05/17/2023 with 2 weeks of blurred vision, tingling/weakness/numbness of right extremities. MRI head and C spine done in the ER reveals evidence of clear multifocal lesions consistent with multiple sclerosis. Significant PMH: asthma.   Clinical Impression   PTA patient independent. Admitted for above and presents with problem list below.  Pt able to complete Adls, transfers and functional mobility with independence; but he does demonstrate decreased coordination, proprioception and sensation in dominant RUE.  Encouraged increased functional use of UE during Adls, exercises and provided built up handle for spoon/pen to increase comfort due to sensation changes. Vision appears [facility] and pt report back to baseline, but noted singular eye vision remains blurry.  Based on performance today, believe he will best benefit from outpatient neuro OT services at dc, pending progress.  Will follow acutely.       Recommendations for follow up therapy are one component of a multi-disciplinary discharge planning process, led by the attending physician.  Recommendations may be updated based on patient status, additional functional criteria and insurance authorization.   Assistance Recommended at Discharge PRN  Patient can return home with the following      Functional Status Assessment  Patient has had a recent decline in their functional status and demonstrates the ability to make significant improvements in function in a reasonable and predictable amount of time.  Equipment Recommendations  None recommended by OT    Recommendations for Other Services       Precautions / Restrictions Precautions Precautions: None Restrictions Weight Bearing Restrictions: No      Mobility Bed Mobility Overal bed mobility:  Independent                  Transfers Overall transfer level: Independent                        Balance Overall balance assessment: Mild deficits observed, not formally tested                                         ADL either performed or assessed with clinical judgement   ADL Overall ADL's : Modified independent                                             Vision Baseline Vision/History: 0 No visual deficits Ability to See in Adequate Light: 0 Adequate Patient Visual Report: No change from baseline Vision Assessment?: Yes Eye Alignment: Within Functional Limits Ocular Range of Motion: Within Functional Limits Alignment/Gaze Preference: Within Defined Limits Tracking/Visual Pursuits: Able to track stimulus in all quads without difficulty Saccades: Within functional limits Visual Fields: No apparent deficits Depth Perception:  [facility]) Additional Comments: pt reports blurry vision lasted a couple days but has now improved; during testing pt with singular eye blurry vision but not with both eyes open     Perception     Praxis      Pertinent Vitals/Pain Pain Assessment Pain Assessment: No/denies pain     Hand Dominance Right   Extremity/Trunk Assessment Upper Extremity Assessment Upper Extremity Assessment: RUE deficits/detail RUE Deficits / Details: strength  4+/5 but decreased FMC/GMC, decreased sensation RUE Sensation: decreased light touch;decreased proprioception RUE Coordination: decreased fine motor;decreased gross motor   Lower Extremity Assessment Lower Extremity Assessment: Defer to PT evaluation   Cervical / Trunk Assessment Cervical / Trunk Assessment: Normal   Communication Communication Communication: No difficulties   Cognition Arousal/Alertness: Awake/alert Behavior During Therapy: WFL for tasks assessed/performed Overall Cognitive Status: Within Functional Limits for tasks assessed                                  General Comments: appears WFL for tasks assessed, no formal assessment completed     General Comments  provided pt with built up handle and blue foam block. encouraged functional use of R UE during everyday tasks.  Educated on using red handle for eating and writing. Educated on exercises with R FMC (tip to tip) and gross grasp with block.    Exercises     Shoulder Instructions      Home Living Family/patient expects to be discharged to:: Private residence Living Arrangements: Parent (siblings) Available Help at Discharge: Family Type of Home: House Home Access: Level entry     Home Layout: One level     Bathroom Shower/Tub: Producer, television/film/video: Standard     Home Equipment: None   Additional Comments: mom works during the day      Prior Functioning/Environment Prior Level of Function : Independent/Modified Independent               ADLs Comments: not working or driving, was working at [employer] about a year ago        OT Problem List: Decreased coordination;Impaired sensation      OT Treatment/Interventions: Neuromuscular education;Therapeutic activities;Patient/family education    OT Goals(Current goals can be found in the care plan section) Acute Rehab OT Goals Patient Stated Goal: get back to normal OT Goal Formulation: With patient Time For Goal Achievement: 06/01/23 Potential to Achieve Goals: Fair  OT Frequency: Min 2X/week    Co-evaluation              AM-PAC OT "6 Clicks" Daily Activity     Outcome Measure Help from another person eating meals?: None Help from another person taking care of personal grooming?: None Help from another person toileting, which includes using toliet, bedpan, or urinal?: None Help from another person bathing (including washing, rinsing, drying)?: None Help from another person to put on and taking off regular upper body clothing?: None Help from another person to put  on and taking off regular lower body clothing?: None 6 Click Score: 24   End of Session Nurse Communication: Mobility status  Activity Tolerance: Patient tolerated treatment well Patient left: in bed;with call bell/phone within reach  OT Visit Diagnosis: Other symptoms and signs involving the nervous system (R29.898)                Time: 1344-1410 OT Time Calculation (min): 26 min Charges:  OT General Charges $OT Visit: 1 Visit OT Evaluation $OT Eval Low Complexity: 1 Low OT Treatments $Self Care/Home Management : 8-22 mins  Barry Brunner, OT Acute Rehabilitation Services Office [phone]   Chancy Milroy 05/18/2023, 2:22 PM

## 2023-05-18 NOTE — Assessment & Plan Note (Signed)
-   Solu-Medrol 1 g daily, day 2 of 5 - CBGs daily and PPI while on Solu-Medrol  - Consult neurology, appreciate expertise

## 2023-05-18 NOTE — ED Notes (Signed)
The floor was called to say we were coming up with this pt

## 2023-05-18 NOTE — Plan of Care (Signed)
  Problem: Activity: Goal: Risk for activity intolerance will decrease Outcome: Not Progressing   Problem: Pain Managment: Goal: General experience of comfort will improve Outcome: Not Progressing   Problem: Safety: Goal: Ability to remain free from injury will improve Outcome: Not Progressing   

## 2023-05-18 NOTE — Progress Notes (Signed)
Paged Dow Adolph MD, that patient's mom, Glee Arvin [phone] wants to speak regarding patient case and results.

## 2023-05-18 NOTE — TOC Initial Note (Signed)
Transition of Care [facility]) - Initial/Assessment Note    Patient Details  Name: Calvin Ward MRN: [id number] Date of Birth: 07-13-2001  [facility] CM/SW Contact:    Ronny Bacon, RN Phone Number: 05/18/2023, 3:58 PM  Clinical Narrative:  Spoke with patient and mom Calvin Ward [phone]). Patient lives at home with parent. Does not have any DME equipment or PCP. Patient has [facility] but aged out of [place] [facility]. Mom will call [facility] to see what options are available for PCP and Neurology. [facility] information added to AVS as option for PCP.   TOC will continue to follow for discharge needs.             Expected Discharge Plan: Home/Self Care Barriers to Discharge: Continued Medical Work up   Patient Goals and CMS Choice Patient states their goals for this hospitalization and ongoing recovery are:: to go home          Expected Discharge Plan and Services   Discharge Planning Services: CM Consult   Living arrangements for the past 2 months: Single Family Home                                      Prior Living Arrangements/Services Living arrangements for the past 2 months: Single Family Home Lives with:: Parents Patient language and need for interpreter reviewed:: Yes Do you feel safe going back to the place where you live?: Yes      Need for Family Participation in Patient Care: No (Comment) Care giver support system in place?: Yes (comment)   Criminal Activity/Legal Involvement Pertinent to Current Situation/Hospitalization: No - Comment as needed  Activities of Daily Living Home Assistive Devices/Equipment: None ADL Screening (condition at time of admission) Patient's cognitive ability adequate to safely complete daily activities?: Yes Is the patient deaf or have difficulty hearing?: No Does the patient have difficulty seeing, even when wearing glasses/contacts?: No Does the  patient have difficulty concentrating, remembering, or making decisions?: No Patient able to express need for assistance with ADLs?: Yes Does the patient have difficulty dressing or bathing?: No Independently performs ADLs?: Yes (appropriate for developmental age) Does the patient have difficulty walking or climbing stairs?: No Weakness of Legs: Right Weakness of Arms/Hands: Right  Permission Sought/Granted Permission sought to share information with : Family Supports, Case Manager Permission granted to share information with : Yes, Verbal Permission Granted  Share Information with NAME: Calvin Ward           Emotional Assessment   Attitude/Demeanor/Rapport: Engaged Affect (typically observed): Appropriate Orientation: : Oriented to Self, Oriented to Place, Oriented to  Time, Oriented to Situation Alcohol / Substance Use: Not Applicable Psych Involvement: No (comment)  Admission diagnosis:  Multiple sclerosis (HCC) [G35] Patient Active Problem List   Diagnosis Date Noted   Vitamin D deficiency 05/18/2023   Multiple sclerosis (HCC) 05/17/2023   Generalized abdominal pain 05/04/2019   PCP:  Pcp, No Pharmacy:   [facility] [phone] - [place],  - [address] AT [facility] [address]  [place] [address] Phone: [phone] Fax: [phone]     Social Determinants of Health (SDOH) Social History: SDOH Screenings   Food Insecurity: No Food Insecurity (05/18/2023)  Housing: Low Risk  (05/18/2023)  Transportation Needs: No Transportation Needs (05/18/2023)  Utilities: Not At Risk (05/18/2023)  Tobacco Use: Low Risk  (05/17/2023)   SDOH Interventions:     Readmission Risk Interventions     No data to display

## 2023-05-18 NOTE — Progress Notes (Signed)
Neurology Progress Note  Patient ID: Calvin Ward is a 22 y.o. with PMHx of  has a past medical history of Asthma.  Subjective: - Tolerating prednisone well, felt like it made him nauseated a bit but no major side effects - No new neurological complaints, feels symptoms are stable  Exam: Vitals:   05/18/23 0321 05/18/23 0739  BP: (!) 135/93 127/82  Pulse: 82 60  Resp: 18 18  Temp: 98.4 F (36.9 C) 98.5 F (36.9 C)  SpO2: 100% 99%   Gen: In bed, comfortable  Resp: non-labored breathing, no grossly audible wheezing Cardiac: Perfusing extremities well  Abd: soft, nt  Neuro: MS: Awake, alert, appropriate CN: Face symmetric, hearing intact, tracks examiner  Motor: 4+/5 right finger extension (proximal RUE not tested), right hip flexion, right knee flexion. 5/5 on the left hand and leg Sensory: Diminished sensation in the right fingertips and right foot in particular   Pertinent Labs:  Basic Metabolic Panel: Recent Labs  Lab 05/17/23 1953 05/18/23 0013  NA 137 137  K 3.8 3.7  CL 102 101  CO2 21* 22  GLUCOSE 87 89  BUN 9 9  CREATININE 1.12 1.05  CALCIUM 9.6 9.5    CBC: Recent Labs  Lab 05/17/23 1953 05/18/23 0013  WBC 4.6 3.8*  NEUTROABS  --  2.1  HGB 15.7 16.8  HCT 44.7 48.3  MCV 86.3 86.7  PLT 381 374    No results found for: "ALT", "AST", "GGT", "ALKPHOS", "BILITOT"   Coagulation Studies: No results for input(s): "LABPROT", "INR" in the last 72 hours.   HIV neg  Impression:  The patient meets 2017 revised McDonald criteria with dissemination in time and space based on at least 2 clinical attacks separated in time (at least one prior presentation), and multiple lesions seen on MRI some of which are enhancing and some of which are not. Characteristic areas of MRI of lesions in this patient include periventricular, cortical/juxtacortical, infratentorial, and spinal cord. Enhancing left parietal tumefactive lesion correlates with his current right  sided very mild hand (finger extension) and leg weakness (hip flexion, knee flexion, gait impairment) /numbness   Recommendations: -Additional disease modifying treatment safety labs: LFTs, HBV/HCV (hepatitis panel), TB testing, JC virus testing, HIV testing, VZV titers -IV Solu-Medrol 1 g IV daily for 5 days, may complete infusions on an outpatient basis if tolerating well or take 1250 mg prednisone by mouth [facility] 2017) per patient preference; patient will consider trying oral medications tomorrow -Outpatient follow-up with Dr. Vickey Huger, Dr. Lewie Loron, or Dr. Epimenio Foot at [facility]    Brooke Dare MD-PhD [facility] [phone]  Available 7 AM to 7 PM, outside these hours please contact Neurologist on call listed on AMION

## 2023-05-18 NOTE — Hospital Course (Signed)
Calvin Ward is a 22 y.o. M with a history of previous spontaneous numbness and weakness which resolved itself (in 2022) who now presents with 2 weks right sided weakness, numbness and blurred vision.  In the ER, MRI brain showed multifocal hyperintensities, consistent with MS.  Admitted for Solu-medrol.

## 2023-05-18 NOTE — Assessment & Plan Note (Signed)
Start vitamin D supplement

## 2023-05-19 DIAGNOSIS — G35 Multiple sclerosis: Secondary | ICD-10-CM | POA: Diagnosis not present

## 2023-05-19 LAB — GLUCOSE, CAPILLARY: Glucose-Capillary: 111 mg/dL — ABNORMAL HIGH (ref 70–99)

## 2023-05-19 MED ORDER — ALUM & MAG HYDROXIDE-SIMETH 200-200-20 MG/5ML PO SUSP: 30.0000 mL | ORAL | Status: DC | PRN

## 2023-05-19 MED ORDER — PREDNISONE 50 MG PO TABS
1250.0000 mg | ORAL_TABLET | Freq: Every morning | ORAL | Status: DC
  Administered 2023-05-19 – 2023-05-20 (×2): 1250 mg via ORAL
  Filled 2023-05-19 (×2): qty 25

## 2023-05-19 MED ORDER — SODIUM CHLORIDE 0.9 % IV SOLN
1000.0000 mg | Freq: Every day | INTRAVENOUS | Status: DC
  Filled 2023-05-19: qty 16

## 2023-05-19 NOTE — Progress Notes (Signed)
  Progress Note   Patient: Calvin Ward ZOX:[id number] DOB: [birth year] DOA: 05/17/2023     2 DOS: the patient was seen and examined on 05/19/2023        Brief hospital course: Mr. Fronheiser is a 22 y.o. M with a history of previous spontaneous numbness and weakness which resolved itself (in 2022) who now presents with 2 weks right sided weakness, numbness and blurred vision.  In the ER, MRI brain showed multifocal hyperintensities, consistent with MS.  Admitted for Solu-medrol.     Assessment and Plan: * Multiple sclerosis (HCC) Glucose okay - Solu-Medrol 1 g daily, day 3 of 5 - CBGs daily and PPI while on Solu-Medrol - Consult neurology, appreciate expertise    Vitamin D deficiency - Continue vitamin D supplement          Subjective: Hand tingling is actually a little better.  No problems from steroids.  Wondering when he can go home.     Physical Exam: BP 123/84 (BP Location: Left Arm)   Pulse 90   Temp 98.8 F (37.1 C) (Oral)   Resp 19   Ht 5' 9.02" (1.753 m)   Wt 66.4 kg   SpO2 98%   BMI 21.61 kg/m   Thin adult male, lying in bed, watching TikTok on his phone RRR, no murmurs, no peripheral edema Respiratory rate normal, lungs clear without rales or wheezes Mentation normal, sensation improved in the right hand, no clear weakness, face symmetric, speech fluent    Data Reviewed: CBGs reviewed Discussed with neurology  Family Communication:     Disposition: Status is: Inpatient         Author: Alberteen Sam, MD 05/19/2023 11:43 AM  For on call review [email or web address].

## 2023-05-19 NOTE — Progress Notes (Signed)
Neurology Progress Note  Patient ID: Calvin Ward is a 22 y.o. with PMHx of  has a past medical history of Asthma.  Subjective: - Tolerating prednisone well, feels a lot better today and would like to transition to oral prednisone today. Gait is stable. Ambulating in room well.  - No new neurological complaints, feels symptoms are stable  Exam: Vitals:   05/19/23 0539 05/19/23 0825  BP: 112/62 108/78  Pulse: 61 86  Resp: 20 19  Temp: 98 F (36.7 C) 98.5 F (36.9 C)  SpO2: 98% 100%   Gen: In bed, comfortable  Resp: non-labored breathing, no grossly audible wheezing Cardiac: Perfusing extremities well  Abd: soft, nt  Neuro: MS: Awake, alert, appropriate CN: Face symmetric, hearing intact, tracks examiner  Motor: 4+/5 right finger extension (proximal RUE not tested), right hip flexion, right knee flexion. 5/5 on the left hand and leg Sensory: Diminished sensation in the right fingertips and right foot in particular   Pertinent Labs:  Basic Metabolic Panel: Recent Labs  Lab 05/17/23 1953 05/18/23 0013  NA 137 137  K 3.8 3.7  CL 102 101  CO2 21* 22  GLUCOSE 87 89  BUN 9 9  CREATININE 1.12 1.05  CALCIUM 9.6 9.5     CBC: Recent Labs  Lab 05/17/23 1953 05/18/23 0013  WBC 4.6 3.8*  NEUTROABS  --  2.1  HGB 15.7 16.8  HCT 44.7 48.3  MCV 86.3 86.7  PLT 381 374     Lab Results  Component Value Date   ALT 22 05/18/2023   AST 25 05/18/2023   ALKPHOS 89 05/18/2023   BILITOT 1.0 05/18/2023     Coagulation Studies: No results for input(s): "LABPROT", "INR" in the last 72 hours.   HIV neg  LFT normal  Hep panel neg  Impression:  The patient meets 2017 revised McDonald criteria with dissemination in time and space based on at least 2 clinical attacks separated in time (at least one prior presentation), and multiple lesions seen on MRI some of which are enhancing and some of which are not. Characteristic areas of MRI of lesions in this patient include  periventricular, cortical/juxtacortical, infratentorial, and spinal cord. Enhancing left parietal tumefactive lesion correlates with his current right sided very mild hand (finger extension) and leg weakness (hip flexion, knee flexion, gait impairment, numbness)  Recommendations: -Additional disease modifying treatment safety labs in process: TB testing, JC virus testing, VZV titers -IV Solu-Medrol 1 g IV daily for 5 days, may complete infusions on an outpatient basis if tolerating well or take 1250 mg prednisone by mouth [facility] 2017) per patient preference; patient will consider trying oral medications tomorrow -Outpatient follow-up with Dr. Vickey Huger, Dr. Lewie Loron, or Dr. Epimenio Foot at [facility] - ambulatory referral placed  Patient seen and examined by NP/APP with MD. MD to update note as needed.   Elmer Picker, DNP, FNP-BC [facility] Pager: [phone]  Seen by NP, discussed with me, billing per NP

## 2023-05-20 ENCOUNTER — Other Ambulatory Visit (HOSPITAL_COMMUNITY): Payer: Self-pay

## 2023-05-20 DIAGNOSIS — G35 Multiple sclerosis: Secondary | ICD-10-CM | POA: Diagnosis not present

## 2023-05-20 LAB — GLUCOSE, CAPILLARY: Glucose-Capillary: 139 mg/dL — ABNORMAL HIGH (ref 70–99)

## 2023-05-20 MED ORDER — PREDNISONE 50 MG PO TABS
ORAL_TABLET | ORAL | 0 refills | Status: DC
  Filled 2023-05-20: qty 25, 1d supply, fill #0

## 2023-05-20 NOTE — TOC Transition Note (Signed)
Transition of Care [facility]) - CM/SW Discharge Note   Patient Details  Name: Calvin Ward MRN: [id number] Date of Birth: Aug 09, 2001  Transition of Care [facility]) CM/SW Contact:  Leone Haven, RN Phone Number: 05/20/2023, 10:06 AM   Clinical Narrative:    Patient is for dc today, his mother is at the bedside and will be transporting him home today.  Hospital follow up is on the AVS.  NCM sent referral for outpatient rehab thru [facility] for outpatient rehab on [address].    Final next level of care: OP Rehab Barriers to Discharge: Barriers Resolved   Patient Goals and CMS Choice      Discharge Placement                         Discharge Plan and Services Additional resources added to the After Visit Summary for     Discharge Planning Services: CM Consult                                 Social Determinants of Health (SDOH) Interventions SDOH Screenings   Food Insecurity: No Food Insecurity (05/18/2023)  Housing: Low Risk  (05/18/2023)  Transportation Needs: No Transportation Needs (05/18/2023)  Utilities: Not At Risk (05/18/2023)  Tobacco Use: Low Risk  (05/17/2023)     Readmission Risk Interventions     No data to display

## 2023-05-20 NOTE — Progress Notes (Signed)
Neurology Progress Note  Patient ID: Calvin Ward is a 22 y.o. with PMHx of  has a past medical history of Asthma.  Subjective: - Tolerated oral prednisone well - Still some right leg weakness, improving overall, numbness essentially resolved.   Exam: Vitals:   05/19/23 1948 05/20/23 0408  BP: 122/74 120/72  Pulse: 98 60  Resp: 19 16  Temp: 99.3 F (37.4 C) 98 F (36.7 C)  SpO2: 100% 99%   Gen: In bed, comfortable  Resp: non-labored breathing, no grossly audible wheezing Cardiac: Perfusing extremities well  Abd: soft, nt  Neuro: MS: Awake, alert, appropriate CN: Face symmetric, hearing intact, tracks examiner. PERRL, no APD, VFF to confrontation, tongue midline Motor: 4+/5 right finger extension on 6/16, today promixal RUE 5/5, 4/5 right hip flexion, right knee flexion, otherwise 5/5. 5/5 on the left hand and leg Sensory: Reporting sensory symptoms resolved Gait: Hemiparetic on the right but improved from admission, slightly unstable at times / possible mild gait apraxia. Able to rise on toes bilaterally    Pertinent Labs:  Basic Metabolic Panel: Recent Labs  Lab 05/17/23 1953 05/18/23 0013  NA 137 137  K 3.8 3.7  CL 102 101  CO2 21* 22  GLUCOSE 87 89  BUN 9 9  CREATININE 1.12 1.05  CALCIUM 9.6 9.5     CBC: Recent Labs  Lab 05/17/23 1953 05/18/23 0013  WBC 4.6 3.8*  NEUTROABS  --  2.1  HGB 15.7 16.8  HCT 44.7 48.3  MCV 86.3 86.7  PLT 381 374     Lab Results  Component Value Date   ALT 22 05/18/2023   AST 25 05/18/2023   ALKPHOS 89 05/18/2023   BILITOT 1.0 05/18/2023     Coagulation Studies: No results for input(s): "LABPROT", "INR" in the last 72 hours.   HIV neg  LFT normal  Hep panel neg  Impression:  The patient meets 2017 revised McDonald criteria with dissemination in time and space based on at least 2 clinical attacks separated in time (at least one prior presentation), and multiple lesions seen on MRI some of which are  enhancing and some of which are not. Characteristic areas of MRI of lesions in this patient include periventricular, cortical/juxtacortical, infratentorial, and spinal cord. Enhancing left parietal tumefactive lesion correlates with his current right sided weakness and numbness, leg > arm, improving well on steroids  Recommendations: -Additional disease modifying treatment safety labs in process: TB testing, JC virus testing, VZV titers -May complete pulse dose steroids with 1250 mg prednisone by mouth today and tomorrow [facility] 2017) per patient preference -Outpatient follow-up with Dr. Vickey Huger, Dr. Lewie Loron, or Dr. Epimenio Foot at [facility] -Inpatient neurology available as needed, please reach out with any questions or concerns  Calvin Dare MD-PhD [facility] [phone]  Available 7 AM to 7 PM, outside these hours please contact Neurologist on call listed on AMION

## 2023-05-20 NOTE — Discharge Summary (Signed)
Physician Discharge Summary   Patient: Calvin Ward MRN: [id number] DOB: 2001-06-01  Admit date:     05/17/2023  Discharge date: 05/20/23  Discharge Physician: Alberteen Sam   PCP: Pcp, No     Recommendations at discharge:  Follow up with Neurology Dr. Epimenio Foot as soon as able Follow up with new PCP as soon as able New PCP: Please check vitamin D level in 3 months     Discharge Diagnoses: Principal Problem:   Multiple sclerosis [facility]) Active Problems:   Vitamin D deficiency        Hospital Course: Calvin Ward is a 22 y.o. M with a history of previous spontaneous numbness and weakness which resolved itself (in 2022) who now presents with 2 weks right sided weakness, numbness and blurred vision.  In the ER, MRI brain showed multifocal hyperintensities, consistent with MS.  Admitted for Solu-medrol.     * Multiple sclerosis (HCC) Admitted and started on high dose steroids.  Symptoms improved slightly.  Discharged to complete day 5 of steroids with prednisone.   Referral to Neurology sent.    Vitamin D deficiency Started on vitamin D supplement            The [place] Controlled Substances Registry was reviewed for this patient prior to discharge.   Consultants: Neurology Procedures performed:  MRI brain   Disposition: Home Diet recommendation:  Regular diet  DISCHARGE MEDICATION: Allergies as of 05/20/2023       Reactions   Shellfish Allergy         Medication List     TAKE these medications    predniSONE 50 MG tablet Commonly known as: DELTASONE Take 25 tablets (1250 mg) once on Tuesday        Follow-up Information     [facility] Follow up in 4 week(s).   Why: Follow up in 2-4 weeks after discharge Contact information: [address] [place] [address] [phone]         [facility] Follow up on 06/04/2023.   Specialty: Family Medicine Why:  2:30 for hospital follow up Contact information: [address] [place] [address] [phone]        [facility] Follow up.   Specialty: Rehabilitation Why: they will call you, if you have not heard from them in 3 business days please call them. thanks Contact information: [address] [phone] mc [place] [address] [phone]                Discharge Instructions     Ambulatory referral to Neurology   Complete by: As directed    An appointment is requested in approximately: 4 weeks New MS diagnosis- please schedule with soonest appt Dr Epimenio Foot, Dr Dohmeier, Dr Lewie Loron   Ambulatory referral to Occupational Therapy   Complete by: As directed    Ambulatory referral to Physical Therapy   Complete by: As directed        Discharge Exam: Filed Weights   05/17/23 1816 05/18/23 0024  Weight: 64.4 kg 66.4 kg    General: Pt is alert, awake, not in acute distress Cardiovascular: RRR, nl S1-S2, no murmurs appreciated.   No LE edema.   Respiratory: Normal respiratory rate and rhythm.  CTAB without rales or wheezes. Abdominal: Abdomen soft and non-tender.  No distension or HSM.   Neuro/Psych: Strength symmetric in upper and lower extremities.  Judgment and insight appear normal.   Condition  at discharge: good  The results of significant diagnostics from this hospitalization (including imaging, microbiology, ancillary and laboratory) are listed below for reference.   Imaging Studies: MR Brain W and Wo Contrast  Result Date: 05/17/2023 CLINICAL DATA:  Right-sided paresthesias, concern for stroke versus multiple sclerosis EXAM: MRI HEAD WITHOUT AND WITH CONTRAST MRI CERVICAL SPINE WITHOUT AND WITH CONTRAST TECHNIQUE: Multiplanar, multiecho pulse sequences of the brain and surrounding structures, and cervical spine, to include the craniocervical junction and cervicothoracic junction, were  obtained without and with intravenous contrast. CONTRAST:  6.44mL GADAVIST GADOBUTROL 1 MMOL/ML IV SOLN COMPARISON:  06/17/2021 MRI head and cervical spine FINDINGS: MRI HEAD FINDINGS Brain: Numerous T2 hyperintense lesions in the bilateral periventricular and right juxtacortical white matter, with additional infratentorial lesion in the right cerebellum, which are new from the prior exam; a lesion adjacent to the right occipital horn was likely present on the prior exam. The largest of these lesions measures 1.8 x 1.6 x 1.5 cm (AP x TR x CC) (series 6, image 24 and series 8, image 80), and is associated with heterogeneous contrast enhancement (series 20, image 37). Other lesions in the bilateral frontal lobes, right parietal lobe, bilateral occipital lobes, bilateral basal ganglia, posterior body and splenium of the corpus callosum, and right cerebellum do not demonstrate associated enhancement. A lesion in the right frontal white matter (series 2, image 34) has a mild ADC correlate, as does a lesion right splenium of the corpus callosum (series 2, image 26). There is a rim of restricted diffusion with ADC correlate at the anterior aspect of the largest lesion, in the left frontoparietal region (series 2, image 33 and series 250, image 33). No acute hemorrhage, mass, mass effect, or midline shift. No hydrocephalus or extra-axial collection. Normal pituitary and craniocervical junction. No hemosiderin deposition to suggest remote hemorrhage. Normal cerebral volume for age. Vascular: Normal arterial flow voids. Normal arterial and venous enhancement. Skull and upper cervical spine: Normal marrow signal. Sinuses/Orbits: Clear paranasal sinuses. No acute finding in the orbits. MRI CERVICAL SPINE FINDINGS Alignment: No listhesis. Vertebrae: No acute fracture, evidence of discitis, or suspicious osseous lesion. Congenitally short pedicles, which narrow the AP diameter of the spinal canal. Cord: Normal signal and  morphology.  No abnormal enhancement. Posterior Fossa, vertebral arteries, paraspinal tissues: Negative. Disc levels: No significant spinal canal stenosis or neural foraminal narrowing. IMPRESSION: 1. Numerous T2 hyperintense lesions in the bilateral cerebral hemispheres and right cerebellum, new from the prior MRI, consistent with multiple sclerosis. The majority of these lesions do not enhance and likely do not represent active demyelination, but the largest lesion, which measures up to 1.8 cm and is in the left frontal parietal white matter, is associated with heterogeneous enhancement and has a rim of restricted diffusion at the anterior aspect, likely an active tumefactive demyelinating lesion. 2. No abnormal signal or enhancement in the cervical spinal cord. 3. Congenitally short pedicles, which narrow the AP diameter of the spinal canal. No significant spinal canal stenosis or neural foraminal narrowing. These results were called by telephone at the time of interpretation on 05/17/2023 at 9:36 pm to provider BUTLER , who verbally acknowledged these results. Electronically Signed   By: Wiliam Ke M.D.   On: 05/17/2023 21:37   MR Cervical Spine W and Wo Contrast  Result Date: 05/17/2023 CLINICAL DATA:  Right-sided paresthesias, concern for stroke versus multiple sclerosis EXAM: MRI HEAD WITHOUT AND WITH CONTRAST MRI CERVICAL SPINE WITHOUT AND WITH CONTRAST TECHNIQUE: Multiplanar, multiecho pulse sequences  of the brain and surrounding structures, and cervical spine, to include the craniocervical junction and cervicothoracic junction, were obtained without and with intravenous contrast. CONTRAST:  6.23mL GADAVIST GADOBUTROL 1 MMOL/ML IV SOLN COMPARISON:  06/17/2021 MRI head and cervical spine FINDINGS: MRI HEAD FINDINGS Brain: Numerous T2 hyperintense lesions in the bilateral periventricular and right juxtacortical white matter, with additional infratentorial lesion in the right cerebellum, which are new  from the prior exam; a lesion adjacent to the right occipital horn was likely present on the prior exam. The largest of these lesions measures 1.8 x 1.6 x 1.5 cm (AP x TR x CC) (series 6, image 24 and series 8, image 80), and is associated with heterogeneous contrast enhancement (series 20, image 37). Other lesions in the bilateral frontal lobes, right parietal lobe, bilateral occipital lobes, bilateral basal ganglia, posterior body and splenium of the corpus callosum, and right cerebellum do not demonstrate associated enhancement. A lesion in the right frontal white matter (series 2, image 34) has a mild ADC correlate, as does a lesion right splenium of the corpus callosum (series 2, image 26). There is a rim of restricted diffusion with ADC correlate at the anterior aspect of the largest lesion, in the left frontoparietal region (series 2, image 33 and series 250, image 33). No acute hemorrhage, mass, mass effect, or midline shift. No hydrocephalus or extra-axial collection. Normal pituitary and craniocervical junction. No hemosiderin deposition to suggest remote hemorrhage. Normal cerebral volume for age. Vascular: Normal arterial flow voids. Normal arterial and venous enhancement. Skull and upper cervical spine: Normal marrow signal. Sinuses/Orbits: Clear paranasal sinuses. No acute finding in the orbits. MRI CERVICAL SPINE FINDINGS Alignment: No listhesis. Vertebrae: No acute fracture, evidence of discitis, or suspicious osseous lesion. Congenitally short pedicles, which narrow the AP diameter of the spinal canal. Cord: Normal signal and morphology.  No abnormal enhancement. Posterior Fossa, vertebral arteries, paraspinal tissues: Negative. Disc levels: No significant spinal canal stenosis or neural foraminal narrowing. IMPRESSION: 1. Numerous T2 hyperintense lesions in the bilateral cerebral hemispheres and right cerebellum, new from the prior MRI, consistent with multiple sclerosis. The majority of these  lesions do not enhance and likely do not represent active demyelination, but the largest lesion, which measures up to 1.8 cm and is in the left frontal parietal white matter, is associated with heterogeneous enhancement and has a rim of restricted diffusion at the anterior aspect, likely an active tumefactive demyelinating lesion. 2. No abnormal signal or enhancement in the cervical spinal cord. 3. Congenitally short pedicles, which narrow the AP diameter of the spinal canal. No significant spinal canal stenosis or neural foraminal narrowing. These results were called by telephone at the time of interpretation on 05/17/2023 at 9:36 pm to provider BUTLER , who verbally acknowledged these results. Electronically Signed   By: Wiliam Ke M.D.   On: 05/17/2023 21:37    Microbiology: No results found for this or any previous visit.  Labs: CBC: Recent Labs  Lab 05/17/23 1953 05/18/23 0013  WBC 4.6 3.8*  NEUTROABS  --  2.1  HGB 15.7 16.8  HCT 44.7 48.3  MCV 86.3 86.7  PLT 381 374   Basic Metabolic Panel: Recent Labs  Lab 05/17/23 1953 05/18/23 0013  NA 137 137  K 3.8 3.7  CL 102 101  CO2 21* 22  GLUCOSE 87 89  BUN 9 9  CREATININE 1.12 1.05  CALCIUM 9.6 9.5   Liver Function Tests: Recent Labs  Lab 05/18/23 1002  AST 25  ALT 22  ALKPHOS 89  BILITOT 1.0  PROT 8.1  ALBUMIN 4.3   CBG: Recent Labs  Lab 05/19/23 1242 05/20/23 0753  GLUCAP 111* 139*    Discharge time spent: approximately 35 minutes spent on discharge counseling, evaluation of patient on day of discharge, and coordination of discharge planning with nursing, social work, pharmacy and case management  Signed: Alberteen Sam, MD [facility] 05/20/2023

## 2023-05-20 NOTE — TOC Transition Note (Signed)
Transition of Care [facility]) - CM/SW Discharge Note   Patient Details  Name: Calvin Ward MRN: [id number] Date of Birth: [birth year]  [facility] CM/SW Contact:  Baldemar Lenis, LCSW Phone Number: 05/20/2023, 9:57 AM   Clinical Narrative:   CSW met with patient and mom at bedside to discuss recommendation for OP PT/OT. Family can transport to appointments at neurorehab, referral sent. CSW explained to family to call and schedule appointments. Information added to AVS, asked RN to reprint. No further TOC needs.    Final next level of care: OP Rehab Barriers to Discharge: Barriers Resolved   Patient Goals and CMS Choice      Discharge Placement                         Discharge Plan and Services Additional resources added to the After Visit Summary for     Discharge Planning Services: CM Consult                                 Social Determinants of Health (SDOH) Interventions SDOH Screenings   Food Insecurity: No Food Insecurity (05/18/2023)  Housing: Low Risk  (05/18/2023)  Transportation Needs: No Transportation Needs (05/18/2023)  Utilities: Not At Risk (05/18/2023)  Tobacco Use: Low Risk  (05/17/2023)     Readmission Risk Interventions     No data to display

## 2023-05-20 NOTE — Progress Notes (Signed)
Occupational Therapy Treatment Patient Details Name: Calvin Ward MRN: [id number] DOB: 2001-11-06 Today's Date: 05/20/2023   History of present illness Pt is a 22 y.o. M who presents 05/17/2023 with 2 weeks of blurred vision, tingling/weakness/numbness of right extremities. MRI head and C spine done in the ER reveals evidence of clear multifocal lesions consistent with multiple sclerosis. Significant PMH: asthma.   OT comments  Pt reports improvements in R hand sensation and coordination though still not completely back to baseline. Pt able to demo ability to clearly write name today and notably excited about this. Provided multiple UE HEP for pt to address at home including theraputty, fine motor coordination activities and AROM digits/hand along w/ squeeze sponge. Anticipate continued improvement once eased back into daily routine.    Recommendations for follow up therapy are one component of a multi-disciplinary discharge planning process, led by the attending physician.  Recommendations may be updated based on patient status, additional functional criteria and insurance authorization.    Assistance Recommended at Discharge PRN  Patient can return home with the following  Assist for transportation;Assistance with cooking/housework   Equipment Recommendations  None recommended by OT    Recommendations for Other Services      Precautions / Restrictions Precautions Precautions: None Restrictions Weight Bearing Restrictions: No       Mobility Bed Mobility Overal bed mobility: Independent                  Transfers                         Balance                                           ADL either performed or assessed with clinical judgement   ADL Overall ADL's : Modified independent                                            Extremity/Trunk Assessment Upper Extremity Assessment Upper Extremity Assessment: RUE  deficits/detail RUE Deficits / Details: reports sensation much improved, no longer pins/needles but still with some slightly diminished sensation. strength WFL and coordination improving. pt able to demo handwriting with increased ease, return demo coordination exercises easily RUE Sensation: decreased light touch RUE Coordination: decreased fine motor   Lower Extremity Assessment Lower Extremity Assessment: Defer to PT evaluation        Vision   Vision Assessment?: No apparent visual deficits   Perception     Praxis      Cognition Arousal/Alertness: Awake/alert Behavior During Therapy: WFL for tasks assessed/performed Overall Cognitive Status: Within Functional Limits for tasks assessed                                          Exercises Exercises: Other exercises Other Exercises Other Exercises: digit opposition x 5 Other Exercises: handwriting name x 10    Shoulder Instructions       General Comments Pt family at bedside    Pertinent Vitals/ Pain       Pain Assessment Pain Assessment: No/denies pain  Home Living  Prior Functioning/Environment              Frequency  Min 2X/week        Progress Toward Goals  OT Goals(current goals can now be found in the care plan section)  Progress towards OT goals: Progressing toward goals  Acute Rehab OT Goals Patient Stated Goal: home today OT Goal Formulation: With patient Time For Goal Achievement: 06/01/23 Potential to Achieve Goals: Fair ADL Goals Pt/caregiver will Perform Home Exercise Program: Right Upper extremity;With written HEP provided;With theraputty Additional ADL Goal #1: Pt will write clearly with R hand using compensatory strategies as needed with independence.  Plan Discharge plan remains appropriate    Co-evaluation                 AM-PAC OT "6 Clicks" Daily Activity     Outcome Measure   Help from  another person eating meals?: None Help from another person taking care of personal grooming?: None Help from another person toileting, which includes using toliet, bedpan, or urinal?: None Help from another person bathing (including washing, rinsing, drying)?: None Help from another person to put on and taking off regular upper body clothing?: None Help from another person to put on and taking off regular lower body clothing?: None 6 Click Score: 24    End of Session    OT Visit Diagnosis: Other symptoms and signs involving the nervous system (R29.898)   Activity Tolerance Patient tolerated treatment well   Patient Left in bed;with call bell/phone within reach;with family/visitor present   Nurse Communication          Time: 9147-8295 OT Time Calculation (min): 10 min  Charges: OT General Charges $OT Visit: 1 Visit OT Treatments $Therapeutic Activity: 8-22 mins  Bradd Canary, OTR/L Acute Rehab Services Office: [phone]   Lorre Munroe 05/20/2023, 10:12 AM

## 2023-05-20 NOTE — Progress Notes (Signed)
Discharge instructions given. Patient verbalized understanding and all questions were answered.  ?

## 2023-05-20 NOTE — Discharge Instructions (Signed)
If you need a medicine for preventing stomach irritation, try an over the counter acid like: Omeprazole Famotidine  Either one can be found at [facility] Take once daily for a few days then stop

## 2023-05-21 LAB — VARICELLA ZOSTER ANTIBODY, IGG: Varicella IgG: 291 index (ref >165)

## 2023-05-21 NOTE — Therapy (Deleted)
OUTPATIENT OCCUPATIONAL THERAPY NEURO EVALUATION  Patient Name: Calvin Ward MRN: [id number] DOB:[birth year], 22 y.o., male Today's Date: 05/21/2023  PCP: none REFERRING PROVIDER: Dr. Joen Laura  END OF SESSION:   Past Medical History:  Diagnosis Date   Asthma    No past surgical history on file. Patient Active Problem List   Diagnosis Date Noted   Vitamin D deficiency 05/18/2023   Multiple sclerosis (HCC) 05/17/2023   Generalized abdominal pain 05/04/2019    ONSET DATE: approx 2 weeks ago  REFERRING DIAG: G35 (ICD-10-CM) - Multiple sclerosis (HCC)  THERAPY DIAG:  No diagnosis found.  Rationale for Evaluation and Treatment: Rehabilitation  SUBJECTIVE:   SUBJECTIVE STATEMENT: *** Pt accompanied by: {accompnied:27141}  PERTINENT HISTORY: history of previous spontaneous numbness and weakness which resolved itself (in 2022) who now presents with 2 weks right sided weakness, numbness and blurred vision.  Vitamin D deficiency   In the ER, MRI brain showed multifocal hyperintensities, consistent with MS.  Admitted for Solu-medrol.  PRECAUTIONS: {Therapy precautions:24002}  WEIGHT BEARING RESTRICTIONS: {Yes ***/No:24003}  PAIN:  Are you having pain? {OPRCPAIN:27236}  FALLS: Has patient fallen in last 6 months? {fallsyesno:27318}  LIVING ENVIRONMENT: Lives with: {OPRC lives with:25569::"lives with their family"} Lives in: {Lives in:25570} Stairs: {opstairs:27293} Has following equipment at home: {Assistive devices:23999}  PLOF: {PLOF:24004}  PATIENT GOALS: ***  OBJECTIVE:   HAND DOMINANCE: {MISC; OT HAND DOMINANCE:[phone]}  ADLs: Overall ADLs: *** Transfers/ambulation related to ADLs: Eating: *** Grooming: *** UB Dressing: *** LB Dressing: *** Toileting: *** Bathing: *** Tub Shower transfers: *** Equipment: {equipment:25573}  IADLs: Shopping: *** Light housekeeping: *** Meal Prep: *** Community mobility: *** Medication  management: *** Financial management: *** Handwriting: {OTWRITTENEXPRESSION:25361}  MOBILITY STATUS: {OTMOBILITY:25360}  POSTURE COMMENTS:  {posture:25561} Sitting balance: {sitting balance:25483}  ACTIVITY TOLERANCE: Activity tolerance: ***  FUNCTIONAL OUTCOME MEASURES: {OTFUNCTIONALMEASURES:27238}  UPPER EXTREMITY ROM:    {AROM/PROM:27142} ROM Right eval Left eval  Shoulder flexion    Shoulder abduction    Shoulder adduction    Shoulder extension    Shoulder internal rotation    Shoulder external rotation    Elbow flexion    Elbow extension    Wrist flexion    Wrist extension    Wrist ulnar deviation    Wrist radial deviation    Wrist pronation    Wrist supination    (Blank rows = not tested)  UPPER EXTREMITY MMT:     MMT Right eval Left eval  Shoulder flexion    Shoulder abduction    Shoulder adduction    Shoulder extension    Shoulder internal rotation    Shoulder external rotation    Middle trapezius    Lower trapezius    Elbow flexion    Elbow extension    Wrist flexion    Wrist extension    Wrist ulnar deviation    Wrist radial deviation    Wrist pronation    Wrist supination    (Blank rows = not tested)  HAND FUNCTION: {handfunction:27230}  COORDINATION: {otcoordination:27237}  SENSATION: {sensation:27233}  EDEMA: ***  MUSCLE TONE: {UETONE:25567}  COGNITION: Overall cognitive status: {cognition:24006}  VISION: Subjective report: *** Baseline vision: {OTBASELINEVISION:25363} Visual history: {OTVISUALHISTORY:25364}  VISION ASSESSMENT: {visionassessment:27231}  Patient has difficulty with following activities due to following visual impairments: ***  PERCEPTION: {Perception:25564}  PRAXIS: {Praxis:25565}  OBSERVATIONS: ***   TODAY'S TREATMENT:  DATE: ***   PATIENT EDUCATION: Education details:  *** Person educated: {Person educated:25204} Education method: {Education Method:25205} Education comprehension: {Education Comprehension:25206}  HOME EXERCISE PROGRAM: ***   GOALS: Goals reviewed with patient? {yes/no:20286}  SHORT TERM GOALS: Target date: ***  *** Baseline: Goal status: {GOALSTATUS:25110}  2.  *** Baseline:  Goal status: {GOALSTATUS:25110}  3.  *** Baseline:  Goal status: {GOALSTATUS:25110}  4.  *** Baseline:  Goal status: {GOALSTATUS:25110}  5.  *** Baseline:  Goal status: {GOALSTATUS:25110}  6.  *** Baseline:  Goal status: {GOALSTATUS:25110}  LONG TERM GOALS: Target date: ***  *** Baseline:  Goal status: {GOALSTATUS:25110}  2.  *** Baseline:  Goal status: {GOALSTATUS:25110}  3.  *** Baseline:  Goal status: {GOALSTATUS:25110}  4.  *** Baseline:  Goal status: {GOALSTATUS:25110}  5.  *** Baseline:  Goal status: {GOALSTATUS:25110}  6.  *** Baseline:  Goal status: {GOALSTATUS:25110}  ASSESSMENT:  CLINICAL IMPRESSION: Patient is a *** y.o. *** who was seen today for occupational therapy evaluation for ***.   PERFORMANCE DEFICITS: in functional skills including {OT physical skills:25468}, cognitive skills including {OT cognitive skills:25469}, and psychosocial skills including {OT psychosocial skills:25470}.   IMPAIRMENTS: are limiting patient from {OT performance deficits:25471}.   CO-MORBIDITIES: {Comorbidities:25485} that affects occupational performance. Patient will benefit from skilled OT to address above impairments and improve overall function.  MODIFICATION OR ASSISTANCE TO COMPLETE EVALUATION: {OT modification:25474}  OT OCCUPATIONAL PROFILE AND HISTORY: {OT PROFILE AND HISTORY:25484}  CLINICAL DECISION MAKING: {OT CDM:25475}  REHAB POTENTIAL: {rehabpotential:25112}  EVALUATION COMPLEXITY: {Evaluation complexity:25115}    PLAN:  OT FREQUENCY: {rehab frequency:25116}  OT DURATION: {rehab  duration:25117}  PLANNED INTERVENTIONS: {OT Interventions:25467}  RECOMMENDED OTHER SERVICES: ***  CONSULTED AND AGREED WITH PLAN OF CARE: {OZD:66440}  PLAN FOR NEXT SESSION: Willa Frater, OTR/L  05/21/2023, 11:18 PM

## 2023-05-22 ENCOUNTER — Ambulatory Visit: Payer: Medicaid Other | Admitting: Occupational Therapy

## 2023-05-22 LAB — QUANTIFERON-TB GOLD PLUS: QuantiFERON-TB Gold Plus: NEGATIVE

## 2023-05-22 LAB — QUANTIFERON-TB GOLD PLUS (RQFGPL)
QuantiFERON Mitogen Value: 0.97 IU/mL
QuantiFERON Nil Value: 0.03 IU/mL
QuantiFERON TB1 Ag Value: 0.01 IU/mL
QuantiFERON TB2 Ag Value: 0.01 IU/mL

## 2023-05-22 LAB — JC VIRUS DNA,PCR (WHOLE BLOOD): JC Virus DNA, PCR, Blood: NEGATIVE

## 2023-05-26 NOTE — Progress Notes (Unsigned)
[facility]  PATIENT: Calvin Ward Ward DOB: 06-24-2001  REFERRING DOCTOR OR PCP: Armida Sans, NP SOURCE: Patient, extensive notes from hospitalization plan 2024) and emergency room visit 2022, imaging and lab reports, multiple MRI images personally reviewed and reviewed again with the patient  _________________________________   HISTORICAL  CHIEF COMPLAINT:  Chief Complaint  Patient presents with   Room 11    Pt is here with his Mother. Pt states that he has some numbness and pins and needles filling in his right hands. Pt states that he is having balance issues.  Pt states that when he woke 2 weeks ago he had a hard time with his eyes focusing and he was unable to watch the TV.     HISTORY OF PRESENT ILLNESS:  I had the pleasure of seeing [name] Ward, Ward, at the [facility] at [facility] for neurologic consultation regarding his recent diagnosis of MS.  Calvin Ward Ward is a 22 year old man who presented to the emergency room on 05/17/2023 with a 2-week history of right-sided weakness, numbness and blurred vision (unclear if monocular or binocular). These symptoms developed overnight as he woke up with this in early June.   The visual symptoms improved by the time he went to the emergency room the other symptoms persisted.  Imaging studies were consistent with multiple sclerosis and a large focus in the left hemisphere enhanced after contrast with a tumefactive appearance.  He was admitted and received 5 days IV Solu-Medrol/high-dose prednisone.  Right sided symptoms were unchanged at discharge but better the next week  In retrospect, in July 2022, he presented with left body numbness lasting a couple weeks that began with a left postauricular headache.  At the time he also had some weakness and pain of the left hand.  Imaging studies were read as normal though I see a couple foci in the brain and at the cervicomedullary junction at that  time.   He was working at the time at [employer] and took a few days.    Currently, gait and balance are improved but not to baseline and he could walk a mile but not as fast as he used to.  He has not climbed stairs so is unsure about using a bannister.   He feels he lens to his right as he walks and he has hit the door frame.   He still has some right arm weakness and leg is getting stronger.    He has numbness in the right arm more than the leg.  He had some spasms in the right leg but these are less common.  He notes urinary urgency.    Vision is almost baseline - mild blurry on left but colors are fine.  Cognitively, his mom notes he has some word finding issues.  Also auditory processing is slower.   He has felt some depression with the diagnosis and he had a couple crying spells.   He has had some mood swings and is more irritable.    He feels more tired.    He  falls asleep easily but wakes up 1-2 times for nocturia and a couple times when he just wakes up.      Imaging: MRI of the head 05/17/2023 shows T2 hyperintense foci at the cervicomedullary junction to the left, left cerebellar hemisphere, in the posterior limbs of the internal capsules and in the periventricular, juxtacortical and deep white matter of both cerebral hemispheres.  A large focus in the posterior  left frontal lobe enhances after contrast.  MRI of the cervical spine 05/17/2023 shows a focus at the cervicomedullary junction towards the left (sagittal views only) and within the spinal cord posteriorly adjacent to C2-C3 and possibly to the left adjacent to C6-C7 (seen only on the axial gradient echo images).  No significant degenerative change.  Normal enhancement pattern  MRI scan of the head 06/17/2021 showed a small T2/FLAIR hyperintense focus in the periventricular white matter of the right temporal-occipital lobe, right middle cerebellar peduncle/cerebellum and a subtle focus to the left at the cervicomedullary junction.  It was  otherwise normal.  MRI of the cervical spine 06/17/2021 showed the focus at the cervicomedullary junction towards the left    Laboratory: June 2024: HIV varicella-zoster IgG, acute hepatitis panel.  TB test CBC with differential and P[place]ock function panel normal, negative or noncontributory  REVIEW OF SYSTEMS: Constitutional: No fevers, chills, sweats, or change in appetite Eyes: No visual changes, double vision, eye pain Ear, nose and throat: No hearing loss, ear pain, nasal congestion, sore throat Cardiovascular: No chest pain, palpitations Respiratory:  No shortness of breath at rest or with exertion.   No wheezes GastrointestinaI: No nausea, vomiting, diarrhea, abdominal pain, fecal incontinence Genitourinary:  No dysuria, urinary retention or frequency.  No nocturia. Musculoskeletal:  No neck pain, back pain Integumentary: No rash, pruritus, skin lesions Neurological: as above Psychiatric: No depression at this time.  No anxiety Endocrine: No palpitations, diaphoresis, change in appetite, change in weigh or increased thirst Hematologic/Lymphatic:  No anemia, purpura, petechiae. Allergic/Immunologic: No itchy/runny eyes, nasal congestion, recent allergic reactions, rashes  ALLERGIES: Allergies  Allergen Reactions   Shellfish Allergy     HOME MEDICATIONS:  Current Outpatient Medications:    Vitamin D, Ergocalciferol, (DRISDOL) 1.25 MG (50000 UNIT) CAPS capsule, Take 1 capsule (50,000 Units total) by mouth every 7 (seven) days., Disp: 13 capsule, Rfl: 1   predniSONE (DELTASONE) 50 MG tablet, Take 25 tablets (1250 mg) once on Tuesday (Patient not taking: Reported on 05/28/2023), Disp: 25 tablet, Rfl: 0  PAST MEDICAL HISTORY: Past Medical History:  Diagnosis Date   Asthma     PAST SURGICAL HISTORY: History reviewed. No pertinent surgical history.  FAMILY HISTORY: Family History  Problem Relation Age of Onset   Multiple sclerosis Neg Hx     SOCIAL HISTORY: Social  History   Socioeconomic History   Marital status: Single    Spouse name: Not on file   Number of children: Not on file   Years of education: Not on file   Highest education level: Not on file  Occupational History   Not on file  Tobacco Use   Smoking status: Never   Smokeless tobacco: Never  Substance and Sexual Activity   Alcohol use: Never   Drug use: Never   Sexual activity: Not on file  Other Topics Concern   Not on file  Social History Narrative   Right Handed   Social Determinants of Health   Financial Resource Strain: Not on file  Food Insecurity: No Food Insecurity (05/18/2023)   Hunger Vital Sign    Worried About Running Out of Food in the Last Year: Never true    Ran Out of Food in the Last Year: Never true  Transportation Needs: No Transportation Needs (05/18/2023)   PRAPARE - Administrator, Civil Service (Medical): No    Lack of Transportation (Non-Medical): No  Physical Activity: Not on file  Stress: Not on file  Social Connections:  Not on file  Intimate Partner Violence: Not on file       PHYSICAL EXAM  Vitals:   05/28/23 0858  BP: 128/72  Pulse: 83  Weight: 145 lb (65.8 kg)  Height: 5\' 8"  (1.727 m)    Body mass index is 22.05 kg/m.   General: The patient is well-developed and well-nourished and in no acute distress  HEENT:  Head is Solen/AT.  Sclera are anicteric.  Funduscopic exam shows normal optic discs and retinal vessels.  Neck: No carotid bruits are noted.  The neck is nontender.  Cardiovascular: The heart has a regular rate and rhythm with a normal S1 and S2. There were no murmurs, gallops or rubs.    Skin: Extremities are without rash or  edema.  Musculoskeletal:  Back is nontender  Neurologic Exam  Mental status: The patient is alert and oriented x 3 at the time of the examination. The patient has apparent normal recent and remote memory, with an apparently normal attention span and concentration ability.   Speech is  normal.  Cranial nerves: Extraocular movements are full. Pupils are equal, round, and reactive to light and accomodation.  Visual fields are full.  He reported symmetric color vision though acuity is a little worse on the left than the right facial symmetry is present. There is good facial sensation to soft touch bilaterally.Facial strength is normal.  Trapezius and sternocleidomastoid strength is normal. No dysarthria is noted.  The tongue is midline, and the patient has symmetric elevation of the soft palate. No obvious hearing deficits are noted.  Motor:  Muscle bulk is normal.   Tone is normal. Strength is  5 / 5 in all 4 extremities.   Sensory: Sensory testing is intact to pinprick, soft touch and vibration sensation on the left but he has reduced touch, temperature and vibration sensation in the right arm more evident than the leg  Coordination: Cerebellar testing reveals good finger-nose-finger and heel-to-shin bilaterally.  Gait and station: Station is normal.   Gait is near normal but the tandem gait is wide. Romberg is negative.   Reflexes: Deep tendon reflexes are symmetric and normal in the arms.  There is spread at the knees (3+).  No ankle clonus..   Plantar responses are flexor.    DIAGNOSTIC DATA (LABS, IMAGING, TESTING) - I reviewed patient records, labs, notes, testing and imaging myself where available.  Lab Results  Component Value Date   WBC 3.8 (L) 05/18/2023   HGB 16.8 05/18/2023   HCT 48.3 05/18/2023   MCV 86.7 05/18/2023   PLT 374 05/18/2023      Component Value Date/Time   NA 137 05/18/2023 0013   K 3.7 05/18/2023 0013   CL 101 05/18/2023 0013   CO2 22 05/18/2023 0013   GLUCOSE 89 05/18/2023 0013   BUN 9 05/18/2023 0013   CREATININE 1.05 05/18/2023 0013   CALCIUM 9.5 05/18/2023 0013   PROT 8.1 05/18/2023 1002   ALBUMIN 4.3 05/18/2023 1002   AST 25 05/18/2023 1002   ALT 22 05/18/2023 1002   ALKPHOS 89 05/18/2023 1002   BILITOT 1.0 05/18/2023 1002    GFRNONAA >60 05/18/2023 0013       ASSESSMENT AND PLAN  Multiple sclerosis (HCC)  Vitamin D deficiency  Numbness  Weakness  Gait disturbance  Vision disturbance     In summary, Calvin Ward Ward is a 22 year old man who was recently diagnosed with MS after presenting with right-sided numbness, weakness and clumsiness.  He had a similar  milder event on the left 2 years ago.  The comparison of MRIs between 2024 and 2022 showed more lesions including the enhancing large lesion in the left cerebral hemisphere causing the current symptoms.  He meets McDonald criteria for multiple sclerosis.  I am concerned that he is presenting with an aggressive course.  Specifically, he has a large exacerbation with motor symptoms.  He also has some foci in the spinal cord and cervicomedullary junction.  Therefore, I believe he needs to begin a highly effective disease modifying therapy as his first treatment.  We discussed Kesimpta in the most detail.  He feels that he could give himself the injection.  We went over the risks and benefits.  Necessary blood work has been completed in the hospital (TB, hep B, HIV were all negative and he is immune for varicella based on IgG.).  I showed him the MRI images and we discussed prognosis and when to call us for possible exacerbation  He has some symptoms including urinary frequency and mild depression but would prefer to hold off of medication at this time.  We discussed that if the symptoms worsen to let me know.  I prescribed vitamin D 50,000 units weekly for 6 months as his level was only 10.  He will return to see me in 4 months but sooner if he has new or worsening neurologic symptoms.  He or his mom is advised to call us in the middle next week if he has not heard from the drug company to start the Kesimpta.  Thank you for asking me to see Calvin Ward Ward.  Please let me know if I can be of further assistance with him or other patients in the future.  75-minute  office visit with the majority of the time spent face-to-face for history and physical, discussion/counseling and decision-making.  Additional time with record review and documentation.   Darrel Gloss A. Epimenio Foot, MD, [facility] 05/28/2023, 10:10 AM Certified in Neurology, Clinical Neurophysiology, Sleep Medicine and Neuroimaging  [facility] [address], [address] [place], [place] [address] [phone]

## 2023-05-28 ENCOUNTER — Ambulatory Visit (INDEPENDENT_AMBULATORY_CARE_PROVIDER_SITE_OTHER): Payer: Medicaid Other | Admitting: Neurology

## 2023-05-28 ENCOUNTER — Telehealth: Payer: Self-pay | Admitting: *Deleted

## 2023-05-28 ENCOUNTER — Encounter: Payer: Self-pay | Admitting: Neurology

## 2023-05-28 VITALS — BP 128/72 | HR 83 | Ht 68.0 in | Wt 145.0 lb

## 2023-05-28 DIAGNOSIS — G35 Multiple sclerosis: Secondary | ICD-10-CM

## 2023-05-28 DIAGNOSIS — R200 Anesthesia of skin: Secondary | ICD-10-CM

## 2023-05-28 DIAGNOSIS — H539 Unspecified visual disturbance: Secondary | ICD-10-CM

## 2023-05-28 DIAGNOSIS — R269 Unspecified abnormalities of gait and mobility: Secondary | ICD-10-CM

## 2023-05-28 DIAGNOSIS — R531 Weakness: Secondary | ICD-10-CM | POA: Diagnosis not present

## 2023-05-28 DIAGNOSIS — E559 Vitamin D deficiency, unspecified: Secondary | ICD-10-CM

## 2023-05-28 MED ORDER — VITAMIN D (ERGOCALCIFEROL) 1.25 MG (50000 UNIT) PO CAPS
50000.0000 [IU] | ORAL_CAPSULE | ORAL | 1 refills | Status: DC
Start: 2023-05-28 — End: 2023-06-15

## 2023-05-28 NOTE — Telephone Encounter (Signed)
Called and spoke w/ pt and mother. Advised we will need front/back of current insurance card to send in w/ Kesimpta start form he signed today. Mother will email copy to [facility]. Debra aware and will be on look out for this.

## 2023-05-29 ENCOUNTER — Encounter: Payer: Self-pay | Admitting: Neurology

## 2023-05-29 NOTE — Telephone Encounter (Signed)
Faxed completed/signed Kesimpta start form to [employer] at [phone]. Received fax confirmation. 

## 2023-05-29 NOTE — Telephone Encounter (Signed)
Called Lois back. Need to fill out bridge/commerical portion and fax back. They will call patient to obtain consent portion.I completed and faxed back to them at [phone]. Received fax confirmation. 

## 2023-05-29 NOTE — Telephone Encounter (Signed)
[employer] [place] [employer]) there was some information left off the form. Section 2 check 3 consent boxes Section 5 bridge to commercial prescription and check yes and check maintenance dose  Please fax back when complete. Can contact if have any questions;Phone number:[phone]

## 2023-05-29 NOTE — Therapy (Deleted)
OUTPATIENT OCCUPATIONAL THERAPY NEURO EVALUATION  Patient Name: Calvin Ward MRN: [id number] DOB:2001-11-19, 22 y.o., male Today's Date: 05/29/2023  PCP: Marland Kitchen REFERRING PROVIDER: Alberteen Sam, MD  END OF SESSION:   Past Medical History:  Diagnosis Date   Asthma    No past surgical history on file. Patient Active Problem List   Diagnosis Date Noted   Vitamin D deficiency 05/18/2023   Multiple sclerosis (HCC) 05/17/2023   Generalized abdominal pain 05/04/2019    ONSET DATE: 05/20/2023 (date of referral)  REFERRING DIAG: G35 (ICD-10-CM) - Multiple sclerosis  THERAPY DIAG:  No diagnosis found.  Rationale for Evaluation and Treatment: Rehabilitation  SUBJECTIVE:   SUBJECTIVE STATEMENT: *** Pt accompanied by: {accompnied:27141}  PERTINENT HISTORY: *** d/c from hospital 05/20/2023 - paresthesias in R hand, difficulty focusing with eyes, and impaired balance  "Imaging studies were consistent with multiple sclerosis and a large focus in the left hemisphere enhanced after contrast with a tumefactive appearance.  He was admitted and received 5 days IV Solu-Medrol/high-dose prednisone.  Right sided symptoms were unchanged at discharge but better the next week. "  He also had similar episode in 2022 with left-sided numbness.  Cognitively, his mom notes he has some word finding issues. Also auditory processing is slower. He has felt some depression with the diagnosis and he had a couple crying spells. He has had some mood swings and is more irritable. He feels more tired. He falls asleep easily but wakes up 1-2 times for nocturia and a couple times when he just wakes up.   PRECAUTIONS: {Therapy precautions:24002}  WEIGHT BEARING RESTRICTIONS: {Yes ***/No:24003}  PAIN:  Are you having pain? {OPRCPAIN:27236}  FALLS: Has patient fallen in last 6 months? {fallsyesno:27318}  LIVING ENVIRONMENT: Lives with: {OPRC lives with:25569::"lives with their family"} Lives in:  {Lives in:25570} Stairs: {opstairs:27293} Has following equipment at home: {Assistive devices:23999}  PLOF: {PLOF:24004}  PATIENT GOALS: ***  OBJECTIVE:   HAND DOMINANCE: {MISC; OT HAND DOMINANCE:[phone]}  ADLs: Overall ADLs: *** Transfers/ambulation related to ADLs: Eating: *** Grooming: *** UB Dressing: *** LB Dressing: *** Toileting: *** Bathing: *** Tub Shower transfers: *** Equipment: {equipment:25573}  IADLs: Shopping: *** Light housekeeping: *** Meal Prep: *** Community mobility: *** Medication management: *** Financial management: *** Handwriting: {OTWRITTENEXPRESSION:25361}  MOBILITY STATUS: {OTMOBILITY:25360}  POSTURE COMMENTS:  {posture:25561} Sitting balance: {sitting balance:25483}  ACTIVITY TOLERANCE: Activity tolerance: ***  FUNCTIONAL OUTCOME MEASURES: {OTFUNCTIONALMEASURES:27238}  UPPER EXTREMITY ROM:    {AROM/PROM:27142} ROM Right eval Left eval  Shoulder flexion    Shoulder abduction    Shoulder adduction    Shoulder extension    Shoulder internal rotation    Shoulder external rotation    Elbow flexion    Elbow extension    Wrist flexion    Wrist extension    Wrist ulnar deviation    Wrist radial deviation    Wrist pronation    Wrist supination    (Blank rows = not tested)  UPPER EXTREMITY MMT:     MMT Right eval Left eval  Shoulder flexion    Shoulder abduction    Shoulder adduction    Shoulder extension    Shoulder internal rotation    Shoulder external rotation    Middle trapezius    Lower trapezius    Elbow flexion    Elbow extension    Wrist flexion    Wrist extension    Wrist ulnar deviation    Wrist radial deviation    Wrist pronation    Wrist supination    (  Blank rows = not tested)  HAND FUNCTION: {handfunction:27230}  COORDINATION: {otcoordination:27237}  SENSATION: {sensation:27233}  EDEMA: ***  MUSCLE TONE: {UETONE:25567}  COGNITION: Overall cognitive status:  {cognition:24006}  VISION: Subjective report: *** Baseline vision: {OTBASELINEVISION:25363} Visual history: {OTVISUALHISTORY:25364}  VISION ASSESSMENT: {visionassessment:27231}  Patient has difficulty with following activities due to following visual impairments: ***  PERCEPTION: {Perception:25564}  PRAXIS: {Praxis:25565}  OBSERVATIONS: ***   TODAY'S TREATMENT:                                                                                                                              DATE: ***   PATIENT EDUCATION: Education details: *** Person educated: {Person educated:25204} Education method: {Education Method:25205} Education comprehension: {Education Comprehension:25206}  HOME EXERCISE PROGRAM: ***   GOALS: Goals reviewed with patient? {yes/no:20286}  SHORT TERM GOALS: Target date: ***  *** Baseline: Goal status: INITIAL  2.  *** Baseline:  Goal status: INITIAL  3.  *** Baseline:  Goal status: INITIAL  4.  *** Baseline:  Goal status: INITIAL  5.  *** Baseline:  Goal status: INITIAL  6.  *** Baseline:  Goal status: INITIAL  LONG TERM GOALS: Target date: ***  *** Baseline:  Goal status: INITIAL  2.  *** Baseline:  Goal status: INITIAL  3.  *** Baseline:  Goal status: INITIAL  4.  *** Baseline:  Goal status: INITIAL  5.  *** Baseline:  Goal status: INITIAL  6.  *** Baseline:  Goal status: INITIAL  ASSESSMENT:  CLINICAL IMPRESSION: Patient is a *** y.o. *** who was seen today for occupational therapy evaluation for ***.   PERFORMANCE DEFICITS: in functional skills including {OT physical skills:25468}, cognitive skills including {OT cognitive skills:25469}, and psychosocial skills including {OT psychosocial skills:25470}.   IMPAIRMENTS: are limiting patient from {OT performance deficits:25471}.   CO-MORBIDITIES: {Comorbidities:25485} that affects occupational performance. Patient will benefit from skilled OT to address  above impairments and improve overall function.  MODIFICATION OR ASSISTANCE TO COMPLETE EVALUATION: {OT modification:25474}  OT OCCUPATIONAL PROFILE AND HISTORY: {OT PROFILE AND HISTORY:25484}  CLINICAL DECISION MAKING: {OT CDM:25475}  REHAB POTENTIAL: {rehabpotential:25112}  EVALUATION COMPLEXITY: {Evaluation complexity:25115}    PLAN:  OT FREQUENCY: {rehab frequency:25116}  OT DURATION: {rehab duration:25117}  PLANNED INTERVENTIONS: {OT Interventions:25467}  RECOMMENDED OTHER SERVICES: ***  CONSULTED AND AGREED WITH PLAN OF CARE: {ZOX:09604}  PLAN FOR NEXT SESSION: Delana Meyer, OT 05/29/2023, 3:23 PM

## 2023-05-30 ENCOUNTER — Ambulatory Visit: Payer: Medicaid Other | Attending: Physical Therapy | Admitting: Physical Therapy

## 2023-05-30 ENCOUNTER — Ambulatory Visit: Payer: Medicaid Other | Admitting: Occupational Therapy

## 2023-06-03 ENCOUNTER — Telehealth: Payer: Self-pay | Admitting: Neurology

## 2023-06-03 ENCOUNTER — Encounter: Payer: Self-pay | Admitting: Neurology

## 2023-06-03 NOTE — Telephone Encounter (Signed)
Dawna/ called from [facility]. Stated pt need PA on Kesmepter starter dosage. PA can be called in to  [phone]

## 2023-06-03 NOTE — Telephone Encounter (Signed)
Spoke to pt and they will be at apt.

## 2023-06-03 NOTE — Telephone Encounter (Signed)
I LM for him on mychart relating to him needing to come in when he gets his first injection. (Reaction).

## 2023-06-04 ENCOUNTER — Ambulatory Visit (INDEPENDENT_AMBULATORY_CARE_PROVIDER_SITE_OTHER): Payer: Medicaid Other | Admitting: Primary Care

## 2023-06-04 ENCOUNTER — Encounter (INDEPENDENT_AMBULATORY_CARE_PROVIDER_SITE_OTHER): Payer: Self-pay | Admitting: Primary Care

## 2023-06-04 VITALS — BP 116/78 | HR 98 | Resp 16 | Ht 68.0 in | Wt 147.6 lb

## 2023-06-04 DIAGNOSIS — Z7689 Persons encountering health services in other specified circumstances: Secondary | ICD-10-CM | POA: Diagnosis not present

## 2023-06-04 DIAGNOSIS — F419 Anxiety disorder, unspecified: Secondary | ICD-10-CM | POA: Diagnosis not present

## 2023-06-04 DIAGNOSIS — Z09 Encounter for follow-up examination after completed treatment for conditions other than malignant neoplasm: Secondary | ICD-10-CM

## 2023-06-04 DIAGNOSIS — F32A Depression, unspecified: Secondary | ICD-10-CM | POA: Diagnosis not present

## 2023-06-04 DIAGNOSIS — G35 Multiple sclerosis: Secondary | ICD-10-CM | POA: Diagnosis not present

## 2023-06-04 NOTE — Progress Notes (Signed)
[facility]   Subjective:   Calvin Ward is a 22 y.o. male presents for hospital follow up and establish care.  Patient presented to the emergency room with Blurred vision, right-sided weakness and numbness.  He was admit date to the hospital was 05/17/23, patient was discharged from the hospital on 05/20/23, patient was admitted for:  Evidence of multiple sclerosis.MRI head and C-spine done.  Patient has given mother permission to be at his appointment.  Patient voices concern and mother questioning this was not the first time the symptoms have occurred he has been to the emergency room on several different occasions and never had a diagnosis of multiple sclerosis. Past Medical History:  Diagnosis Date   Asthma      Allergies  Allergen Reactions   Shellfish Allergy    Current Outpatient Medications on File Prior to Visit  Medication Sig Dispense Refill   Vitamin D, Ergocalciferol, (DRISDOL) 1.25 MG (50000 UNIT) CAPS capsule Take 1 capsule (50,000 Units total) by mouth every 7 (seven) days. 13 capsule 1   predniSONE (DELTASONE) 50 MG tablet Take 25 tablets (1250 mg) once on Tuesday (Patient not taking: Reported on 05/28/2023) 25 tablet 0   No current facility-administered medications on file prior to visit.   Review of System: Comprehensive ROS Pertinent positive and negative noted in HPI    Objective:  Blood Pressure 116/78   Pulse 98   Respiration 16   Height 5\' 8"  (1.727 m)   Weight 147 lb 9.6 oz (67 kg)   Oxygen Saturation 99%   Body Mass Index 22.44 kg/m   Filed Weights   06/04/23 1449  Weight: 147 lb 9.6 oz (67 kg)   Physical Exam: General Appearance: Well nourished, in no apparent distress. Eyes: PERRLA, EOMs, conjunctiva no swelling or erythema Sinuses: No Frontal/maxillary tenderness ENT/Mouth: Ext aud canals clear,(scant amount of cerumen in left ear) TMs without erythema, bulging.Hearing normal.  Neck: Supple, thyroid normal.  Respiratory:  Respiratory effort normal, BS equal bilaterally without rales, rhonchi, wheezing or stridor.  Cardio: RRR with no MRGs. Brisk peripheral pulses without edema.  Abdomen: Soft, + BS.  Non tender, no guarding, rebound, hernias, masses. Lymphatics: Non tender without lymphadenopathy.  Musculoskeletal: Full ROM, 5/5 strength, normal gait.  Skin: Warm, dry without rashes, lesions, ecchymosis.  Neuro: Cranial nerves intact. Normal muscle tone, no cerebellar symptoms. Sensation numbness and tingling hands  Psych: Awake and oriented X 3, normal affect, Insight and Judgment appropriate.    Assessment:  Calvin Ward was seen today for hospitalization follow-up.  Diagnoses and all orders for this visit:  Encounter to establish care  Hospital discharge follow-up predniSONE 50 MG Take 25 tablets (1250 mg) once on Tuesday d/c medication  Follow up with [facility] in 4 weeks (06/17/2023); Follow up in 2-4 weeks after discharge- seen on 05/28/2023 By [facility] Follow up with [facility] (Family Medicine) on 06/04/2023; 2:30 for hospital follow up predniSONE 50 MG Take 25 tablets (1250 mg) once on Tuesday Follow up with [facility] (Rehabilitation); they will call you, if you have not heard from them in 3 business days please call them. Thanks  Multiple sclerosis (HCC) Follow up By [facility]  Anxiety and depression Flowsheet Row Office Visit from 06/04/2023 in [facility]  PHQ-9 Total Score 18     Questioning that keeps going through his mind is multiple sclerosis how long have I had  how is it going to make me feel what changes are going to be have to be made in my life.  Can I continue to work even when I do not feel well. Will refer to licensed clinical social worker and psychiatry       This note has been created with Biomedical engineer. Any transcriptional errors are unintentional.   Calvin Sessions, NP 06/04/2023, 3:13 PM

## 2023-06-04 NOTE — Telephone Encounter (Signed)
I called spoke to mother of pt.  They are due to get the medication Kesimpta tomorrow.  They will give [place] a call when received.  She understands will close tomorrow at 5 pm.  Ok to come in for teaching of how to administer.

## 2023-06-05 NOTE — Telephone Encounter (Signed)
Attempted PA for Kesimpta starter pack as requested via CMM. Received this message:

## 2023-06-05 NOTE — Telephone Encounter (Signed)
I have also attempted another Kesimpta starter pack PA via CMM.  I sent to [facility]. Key: [id number].

## 2023-06-05 NOTE — Telephone Encounter (Signed)
I reached out to Franquez at [employer] for futher clarification on the PA needed.

## 2023-06-10 ENCOUNTER — Telehealth (INDEPENDENT_AMBULATORY_CARE_PROVIDER_SITE_OTHER): Payer: Self-pay | Admitting: Licensed Clinical Social Worker

## 2023-06-10 ENCOUNTER — Encounter (INDEPENDENT_AMBULATORY_CARE_PROVIDER_SITE_OTHER): Payer: Self-pay

## 2023-06-10 NOTE — Telephone Encounter (Signed)
Spoke with clients mother, pt was not available. Sent resources via My Chart. Pt was referred by PCP for anxiety and depression.   Counseling Resources   [email or web address].br  [facility] [address] [place], [place] [address] [phone] or [phone] Walk-in urgent care 24/7 for anyone  For [facility] ONLY New patient assessments and therapy walk-ins: Monday and Wednesday 8am-11am First and second Friday 1pm-5pm New patient psychiatry and medication management walk-ins: Mondays, Wednesdays, Thursdays, Fridays 8am-11am No psychiatry walk-ins on Tuesday   *Accepts all insurance and uninsured for Urgent Care needs *Accepts Medicaid and uninsured for outpatient treatment   [facility] (Therapy and psychiatry) [facility] (near [employer]) [address], [address] [place], [place] [address] [phone] Fax: [phone] (INSURANCE REQUIRED-MEDICAID ACCEPTED)   [facility] Address: Four Locations  -[address] [place], [place], [place]                                 Phone: [phone] -[address], [place], [place]         Phone: [phone] -[address], [place], [place]                      Phone: [phone] -[address], [place], [place]         Phone: [phone] Age Range: Children, Adolescents, and Adults Specialty Areas: Depression, Anxiety, ADHD, Substance Abuse, Bipolar Disorder, etc.  [facility] Address: [address], [place], [place] Phone: [phone] Age Range: Children, Adults, and Elders Specialty Areas: Couple, Family, Group, Individual     [facility] Address: [address] [place], [place] Phone: [phone] Age Range: Children, Adolescents, and Adults Specialty Areas: Individual, Family and Couples Therapy, and Substance Abuse  [facility] Address: [address] [place], [place] [address] Phone: [phone] Age Range: Adolescents/Teenagers and Adults Specialty Areas: Individual, Family, Couples, and Group Counseling   [facility] Address: [address] [place], [place] Phone: [phone] Age Range: Children and Adults Specialty Areas: Individual, Group, and Family counseling to people who have experienced domestic violence or sexual assault  [facility] Address: [address] [place], [place] [address] Phone: [phone] Age Range: Adults & Children (Ages 3+) Specialty Areas: Mental Health and Substance Abuse Counseling Services  [facility] Address: [address] [place], [place], [place] [address] Phone: [phone]             Age Range: All Ages  Specialty Areas: Common mental health diagnoses such as Anxiety, Depression, ADD/ADHD, Bipolar, and PTSD, Substance Abuse Evaluations and Counseling   [facility] at [facility] [address] [place] [address] [place],  [place]  [address] [phone] Call for appointment  [facility] [place] (Therapy only)  [facility] [address], [place], [place] [address] Monday - Friday: 8:30 a.m.-12 p.m. / 1 p.m.-2:30 p.m.  [facility] [address] [place], [place] [address] Monday-Friday: 8:30 a.m.-12 p.m. / 2-3:30 p.m. (INSURANCE REQUIRED -MEDICAID ACCEPTED) They do offer a sliding fee scale $20-$30/session   [facility] [address] [place], [place] [address] Phone: [phone]  [facility] [address] [place] [address]  Phone: [phone] (Does not accept Medicaid) (only one provider accepts Medicare)  [facility] [address] (at [employer], [place] [address] (Accepts Medicaid and Medicare)  [facility]) [address] [place] [address]   [place], [place] [address]  Phone: [phone]  [address] [place], [place] [address] Phone: [phone] [facility] Medicaid) [facility] (Therapy only)  [address] [place], [place] [address] Phone: [phone]   [facility] (Therapy only)  [address] [place], [place] [address] Phone: [phone] [facility] & Medicare)   [facility] [address], [address] [place], [place] [address] Phone: [phone] (Accepts [place] Health Choice) [facility] [phone] [address] [place], [place] [address] Phone: [phone] [facility]) [facility] (Psychiatry only)  [phone] [address], [place], [place] [address] (Accepts Medicaid and Medicare) [facility] (Psychiatry and therapy)  [address] [place], [place] [address] [phone] [facility] Medicare) [facility] (psychiatry and therapy) [address], [place], [place] [address] [phone]  [facility] at [address], [place], [place] [address] [phone] Accept [address], [place], [place], [place], [place],  and the following types of Medicaid; Alliance, [place], Partners, [place], [place] Health Choice, [employer], Healthy [place], [place], and [place], as well as offering a Careers adviser and private payment options. Provides In-Office Appointments, Virtual Appointments, and Phone Consultations Offers medication management for ages 104 years old and up, including,  Medication Management for Suboxone and [facility] [phone] [address], [place], [place] [address] (Accepts Medicaid and Medicare)         19.  [facility] (therapy only)  [address] [place], [place] [address]            [phone] (Accepts medicare) 20. [facility]  (Suboxone and methodone) [phone] [address] [place], [place] [address] To Be Eligible for Opioid Treatment at [facility] you must be at least 21 years of age you have already tried other interventions that were not successful such as opioid detox, inpatient rehab for opioids, or outpatient counseling specifically for opioid dependency your [facility] drug test must be completely free of benzodiazepines (klonopin, xanax, valium, ativan, or other benz) you have reliable transportation to the [facility] clinic in [place] you recognize that counseling is a critical component of [facility]' Opioid Program and you agree to attend all required counseling sessions you are committed to total drug abstinence and will conscientiously strive to remain free of alcohol, marijuana, and other illicit substances while in treatment you desire a peaceful treatment atmosphere in which personal responsibility and respect toward staff and clients is the norm   21. [facility] [address] [place], [place], [place] [address] Offers [facility] (Substance Abuse Intensive Outpatient Program) [phone] 22. [facility] [address] [place], [place] [address] [phone] (Accepts medicare)  For those who are tech savvy, go on psychology today, type in your local city (i.e. [place]. [place]) and specify your insurance at the top of the screen after you search. (Medicaid if needed). You can also specify whether you are interested in therapy and psychiatry.  www.psychologytoday.com/us

## 2023-06-10 NOTE — Telephone Encounter (Signed)
APPROVAL [employer]

## 2023-06-12 ENCOUNTER — Encounter: Payer: Self-pay | Admitting: Neurology

## 2023-06-12 ENCOUNTER — Other Ambulatory Visit: Payer: Self-pay | Admitting: Neurology

## 2023-06-12 ENCOUNTER — Encounter (HOSPITAL_BASED_OUTPATIENT_CLINIC_OR_DEPARTMENT_OTHER): Payer: Self-pay

## 2023-06-12 ENCOUNTER — Emergency Department (HOSPITAL_BASED_OUTPATIENT_CLINIC_OR_DEPARTMENT_OTHER)
Admission: EM | Admit: 2023-06-12 | Discharge: 2023-06-12 | Disposition: A | Discharge status: Home / Self Care | Payer: Medicaid Other | Attending: Emergency Medicine | Admitting: Emergency Medicine

## 2023-06-12 ENCOUNTER — Other Ambulatory Visit: Payer: Self-pay

## 2023-06-12 DIAGNOSIS — R112 Nausea with vomiting, unspecified: Secondary | ICD-10-CM | POA: Insufficient documentation

## 2023-06-12 DIAGNOSIS — R1110 Vomiting, unspecified: Secondary | ICD-10-CM | POA: Diagnosis present

## 2023-06-12 HISTORY — DX: Multiple sclerosis: G35

## 2023-06-12 LAB — CBC
HCT: 42.1 % (ref 39.0–52.0)
Hemoglobin: 14.9 g/dL (ref 13.0–17.0)
MCH: 30.8 pg (ref 26.0–34.0)
MCHC: 35.4 g/dL (ref 30.0–36.0)
MCV: 87.2 fL (ref 80.0–100.0)
Platelets: 408 10*3/uL — ABNORMAL HIGH (ref 150–400)
RBC: 4.83 MIL/uL (ref 4.22–5.81)
RDW: 11.8 % (ref 11.5–15.5)
WBC: 15.6 10*3/uL — ABNORMAL HIGH (ref 4.0–10.5)
nRBC: 0 % (ref 0.0–0.2)

## 2023-06-12 LAB — COMPREHENSIVE METABOLIC PANEL
ALT: 27 U/L (ref 0–44)
AST: 17 U/L (ref 15–41)
Albumin: 4.9 g/dL (ref 3.5–5.0)
Alkaline Phosphatase: 60 U/L (ref 38–126)
Anion gap: 11 (ref 5–15)
BUN: 11 mg/dL (ref 6–20)
CO2: 25 mmol/L (ref 22–32)
Calcium: 10 mg/dL (ref 8.9–10.3)
Chloride: 102 mmol/L (ref 98–111)
Creatinine, Ser: 0.88 mg/dL (ref 0.61–1.24)
GFR, Estimated: >60 mL/min (ref >60)
Glucose, Bld: 146 mg/dL — ABNORMAL HIGH (ref 70–99)
Potassium: 4.3 mmol/L (ref 3.5–5.1)
Sodium: 138 mmol/L (ref 135–145)
Total Bilirubin: 0.8 mg/dL (ref 0.3–1.2)
Total Protein: 7.6 g/dL (ref 6.5–8.1)

## 2023-06-12 LAB — LIPASE, BLOOD: Lipase: <10 U/L — ABNORMAL LOW (ref 11–51)

## 2023-06-12 MED ORDER — ONDANSETRON HCL 4 MG/2ML IJ SOLN
4.0000 mg | Freq: Once | INTRAMUSCULAR | Status: AC | PRN
  Administered 2023-06-12: 4 mg via INTRAVENOUS
  Filled 2023-06-12: qty 2

## 2023-06-12 MED ORDER — ONDANSETRON 4 MG PO TBDP
4.0000 mg | ORAL_TABLET | Freq: Once | ORAL | Status: AC
  Administered 2023-06-12: 4 mg via ORAL
  Filled 2023-06-12: qty 1

## 2023-06-12 MED ORDER — PROMETHAZINE HCL 25 MG PO TABS: 25.0000 mg | ORAL_TABLET | Freq: Four times a day (QID) | ORAL | 1 refills | Status: DC | PRN

## 2023-06-12 NOTE — Discharge Instructions (Addendum)
As discussed, your work is unremarkable.  There is signs of electrolyte abnormality, dehydration, or AKI.  Take Phenergan as needed for nausea and vomiting as prescribed by neurologist today.  You can follow a bland diet for the next several days. Make sure you're drinking plenty of fluids to maintain hydration and electrolyte balance.  Follow-up with your primary care provider within the next week for reevaluation of symptoms.  If you do not have a primary care provider there is a phone number in the discharge report that you can call to get established with one.  Please return to the ED if: You have pain in your chest, neck, arm, or jaw. You feel very weak or you faint. You vomit again and again. You have vomit that is bright red or looks like black coffee grounds. You have bloody or black poop (stools) or poop that looks like tar. You have a very bad headache, a stiff neck, or both. You have very bad pain, cramping, or bloating in your belly (abdomen). You have trouble breathing. You are breathing very quickly. Your heart is beating very quickly. Your skin feels cold and clammy. You feel confused. You have signs of losing too much water in your body, such as: Dark pee, very little pee, or no pee. Cracked lips. Dry mouth. Sunken eyes. Sleepiness. Weakness.

## 2023-06-12 NOTE — ED Notes (Signed)
Water and Ginger ale provided to patient and instructed to take small sips as tolerated.

## 2023-06-12 NOTE — ED Provider Notes (Signed)
[place] EMERGENCY DEPARTMENT AT [facility] Provider Note   CSN: [id number] Arrival date & time: 06/12/23  1548     History  Chief Complaint  Patient presents with   Emesis    Calvin Ward is a 22 y.o. male with a history of multiple sclerosis who presents to the ED today for vomiting.  Patient reports vomiting since about 9 AM this morning.  He cannot tell me how many times he threw up today but denies any blood in the vomit.  He reports a recent episode of constipation but denies diarrhea, fever, or abdominal pain.  Denies any recent illness, sick contact, or new foods. No other complaints or concerns at this time.    Home Medications Prior to Admission medications   Medication Sig Start Date End Date Taking? Authorizing Provider  promethazine (PHENERGAN) 25 MG tablet Take 1 tablet (25 mg total) by mouth every 6 (six) hours as needed for nausea or vomiting. 06/12/23   Sater, Pearletha Furl, MD  predniSONE (DELTASONE) 50 MG tablet Take 25 tablets (1250 mg) once on Tuesday Patient not taking: Reported on 05/28/2023 05/20/23   Alberteen Sam, MD  Vitamin D, Ergocalciferol, (DRISDOL) 1.25 MG (50000 UNIT) CAPS capsule Take 1 capsule (50,000 Units total) by mouth every 7 (seven) days. 05/28/23   Sater, Pearletha Furl, MD      Allergies    Shellfish allergy    Review of Systems   Review of Systems  Gastrointestinal:  Positive for vomiting.  All other systems reviewed and are negative.   Physical Exam Updated Vital Signs BP 124/72   Pulse 100   Temp (!) 97.5 F (36.4 C) (Oral)   Resp (!) 22   SpO2 100%  Physical Exam Vitals and nursing note reviewed.  Constitutional:      Appearance: Normal appearance.  HENT:     Head: Normocephalic and atraumatic.     Mouth/Throat:     Mouth: Mucous membranes are moist.  Eyes:     Conjunctiva/sclera: Conjunctivae normal.     Pupils: Pupils are equal, round, and reactive to light.  Cardiovascular:     Rate and Rhythm:  Normal rate and regular rhythm.     Pulses: Normal pulses.  Pulmonary:     Effort: Pulmonary effort is normal.     Breath sounds: Normal breath sounds.  Abdominal:     Palpations: Abdomen is soft.     Tenderness: There is no abdominal tenderness.  Skin:    General: Skin is warm and dry.     Capillary Refill: Capillary refill takes less than 2 seconds.     Findings: No rash.  Neurological:     General: No focal deficit present.     Mental Status: He is alert.     Motor: No weakness.  Psychiatric:        Mood and Affect: Mood normal.        Behavior: Behavior normal.     ED Results / Procedures / Treatments   Labs (all labs ordered are listed, but only abnormal results are displayed) Labs Reviewed  LIPASE, BLOOD - Abnormal; Notable for the following components:      Result Value   Lipase <10 (*)    All other components within normal limits  COMPREHENSIVE METABOLIC PANEL - Abnormal; Notable for the following components:   Glucose, Bld 146 (*)    All other components within normal limits  CBC - Abnormal; Notable for the following components:   WBC 15.6 (*)  Platelets 408 (*)    All other components within normal limits  URINALYSIS, ROUTINE W REFLEX MICROSCOPIC    EKG None  Radiology No results found.  Procedures Procedures: not indicated.   Medications Ordered in ED Medications  ondansetron (ZOFRAN-ODT) disintegrating tablet 4 mg (has no administration in time range)  ondansetron (ZOFRAN) injection 4 mg (4 mg Intravenous Given 06/12/23 1732)    ED Course/ Medical Decision Making/ A&P                             Medical Decision Making Amount and/or Complexity of Data Reviewed Labs: ordered.  Risk Prescription drug management.   This patient presents to the ED for concern of vomiting, this involves an extensive number of treatment options, and is a complaint that carries with it a high risk of complications and morbidity.   Differential diagnosis  includes:  gastritis, gastroenteritis, DKA, electrolyte abnormalities, pancreatitis, biliary colic, etc.   Co morbidities that complicate the patient evaluation  Multiple sclerosis   Additional history obtained:  Additional history obtained from records.   Lab Tests:  I ordered and personally interpreted labs.  The pertinent results include:   CMP, CBC, and lipase are within normal limits.  No acute infection, anemia, electrolyte abnormality or AKI.   Problem List / ED Course / Critical interventions / Medication management  Vomiting I ordered medications including: Zofran for nausea and vomiting Reevaluation of the patient after these medicines showed that the patient improved I have reviewed the patients home medicines and have made adjustments as needed   Social Determinants of Health:  Depression   Test / Admission - Considered:  Reviewed labs with patient. He was able to tolerate PO.  Advised to take Phenergan as prescribed by neurologist earlier today for nausea. Patient stable and safe for discharge home.        Final Clinical Impression(s) / ED Diagnoses Final diagnoses:  Nausea and vomiting, unspecified vomiting type    Rx / DC Orders ED Discharge Orders     None         Maxwell Marion, PA-C 06/12/23 2133    Arby Barrette, MD 06/13/23 [phone]

## 2023-06-12 NOTE — ED Triage Notes (Signed)
[facility] reports pt coming from home c/o emesis and abdominal pain that started today around 0900. Pt with new diagnosis of MS and was waiting on his meds to be delivered today.

## 2023-06-12 NOTE — ED Notes (Signed)
All appropriate discharge materials reviewed at length with patient. Time for questions provided. Pt has no other questions at this time and verbalizes understanding of all provided materials.  

## 2023-06-12 NOTE — ED Notes (Signed)
Patient tolerated PO challenge well. Denies vomiting when drinking water.

## 2023-06-13 NOTE — Telephone Encounter (Signed)
I would recommend continuing the Phenergan over the weekend and if he is back to his normal self, to start the [facility] Monday.

## 2023-06-14 ENCOUNTER — Encounter (HOSPITAL_COMMUNITY): Payer: Self-pay | Admitting: *Deleted

## 2023-06-14 ENCOUNTER — Inpatient Hospital Stay (HOSPITAL_COMMUNITY)
Admission: EM | Admit: 2023-06-14 | Discharge: 2023-06-17 | DRG: 060 | Disposition: A | Discharge status: Home / Self Care | Payer: Medicaid Other | Attending: Family Medicine | Admitting: Family Medicine

## 2023-06-14 DIAGNOSIS — J45909 Unspecified asthma, uncomplicated: Secondary | ICD-10-CM | POA: Diagnosis present

## 2023-06-14 DIAGNOSIS — Z91013 Allergy to seafood: Secondary | ICD-10-CM

## 2023-06-14 DIAGNOSIS — G35 Multiple sclerosis: Principal | ICD-10-CM | POA: Diagnosis present

## 2023-06-14 DIAGNOSIS — Z79899 Other long term (current) drug therapy: Secondary | ICD-10-CM

## 2023-06-14 LAB — CBC
HCT: 47.5 % (ref 39.0–52.0)
Hemoglobin: 16.6 g/dL (ref 13.0–17.0)
MCH: 31.1 pg (ref 26.0–34.0)
MCHC: 34.9 g/dL (ref 30.0–36.0)
MCV: 89 fL (ref 80.0–100.0)
Platelets: 434 10*3/uL — ABNORMAL HIGH (ref 150–400)
RBC: 5.34 MIL/uL (ref 4.22–5.81)
RDW: 11.7 % (ref 11.5–15.5)
WBC: 5.7 10*3/uL (ref 4.0–10.5)
nRBC: 0 % (ref 0.0–0.2)

## 2023-06-14 LAB — COMPREHENSIVE METABOLIC PANEL
ALT: 25 U/L (ref 0–44)
AST: 19 U/L (ref 15–41)
Albumin: 4.6 g/dL (ref 3.5–5.0)
Alkaline Phosphatase: 67 U/L (ref 38–126)
Anion gap: 10 (ref 5–15)
BUN: 12 mg/dL (ref 6–20)
CO2: 25 mmol/L (ref 22–32)
Calcium: 9.7 mg/dL (ref 8.9–10.3)
Chloride: 103 mmol/L (ref 98–111)
Creatinine, Ser: 1.06 mg/dL (ref 0.61–1.24)
GFR, Estimated: >60 mL/min (ref >60)
Glucose, Bld: 91 mg/dL (ref 70–99)
Potassium: 3.5 mmol/L (ref 3.5–5.1)
Sodium: 138 mmol/L (ref 135–145)
Total Bilirubin: 1.5 mg/dL — ABNORMAL HIGH (ref 0.3–1.2)
Total Protein: 7.9 g/dL (ref 6.5–8.1)

## 2023-06-14 LAB — LIPASE, BLOOD: Lipase: 33 U/L (ref 11–51)

## 2023-06-14 LAB — MAGNESIUM: Magnesium: 2.2 mg/dL (ref 1.7–2.4)

## 2023-06-14 MED ORDER — LACTATED RINGERS IV BOLUS
1000.0000 mL | Freq: Once | INTRAVENOUS | Status: AC
  Administered 2023-06-15: 1000 mL via INTRAVENOUS

## 2023-06-14 NOTE — ED Triage Notes (Signed)
Abd pain  since Wednesday   c/o dizziness  new diagnosis of ms

## 2023-06-14 NOTE — Plan of Care (Signed)
Called by Dr. Willeen Cass in the ED regarding imaging recommendations for a 22 year old with recent diagnosis of MS, currently awaiting initiation of ofatumumab but presenting with worsening blurred vision and tingling all over.  Caveat-had a recent gastroenteritis and diarrheal illness. Symptoms either related to worsening of MS/MS flare versus pseudo exacerbation in the setting of acute illness. I recommend obtaining further imaging to make a decision on steroids-I recommend MRI brain, orbits and C-spine-all scans to be done with and without contrast to look for any abnormal enhancement. If there is evidence of abnormal enhancement, will need to admit him for high-dose steroids.  If no abnormal enhancement seen, can continue to follow outpatient and initiate treatment with ofatumumab. Please call back once imaging is completed to discuss again.   Milon Dikes, MD Neurology [phone]

## 2023-06-14 NOTE — ED Notes (Signed)
Unable to collect blood from pt.

## 2023-06-14 NOTE — ED Provider Notes (Signed)
[place] EMERGENCY DEPARTMENT AT [facility] Provider Note  MDM   HPI/ROS:  Calvin Ward is a 22 y.o. male with a medical history as below significant for multiple sclerosis who presents for evaluation of tingling and blurry vision.  He reports that he was recently diagnosed with MS, and 3 days ago began experiencing diarrhea, nausea and vomiting.  His diarrheal illness has since passed however today he is feeling more weak and has tingling in bilateral upper extremities, and paresthesias on the right lower extremity, additionally he reports blurry vision.  He states that this all started today.  Denies any syncope, dizziness, trauma, or any difficulty with walking.  Physical exam is notable for: - No focal weakness - Right-sided sensation change - Bilateral upper extremity sensation change - Blurry vision  On my initial evaluation, patient is:  -Vital signs stable. Patient afebrile, hemodynamically stable, and non-toxic appearing. -Additional history obtained from chart review  This patient's current presentation, including their history and physical exam, is most consistent with MS relapse. Differentials include metabolic derangement secondary to diarrheal illness.  History and physical exam do not support any diagnosis of CVA, no traumatic injury or history.  Will plan to pursue basic labs, and if normal will reach out to neurology.   Interpretations, interventions, and the patient's course of care are documented below.    Clinical Course as of 06/15/23 0033  Caleen Essex Jun 14, 2023  2100 Discussed the case with neurology who recommended that we order MRI of the orbits, cervical spine, and brain to evaluate any possible changes in his MS lesions given his new symptoms including bilateral neuro symptoms in his upper extremities and blurry vision. [BB]    Clinical Course User Index [BB] Fayrene Helper, MD      Disposition:  Care handed off to oncoming team for continued  workup and disposition.  Please see their note for further details of the patient's ED course.  Clinical Impression:  1. Multiple sclerosis exacerbation (HCC)     Rx / DC Orders ED Discharge Orders     None       The plan for this patient was discussed with Dr. Jearld Fenton, who voiced agreement and who oversaw evaluation and treatment of this patient.   Clinical Complexity A medically appropriate history, review of systems, and physical exam was performed.  My independent interpretations of EKG, labs, and radiology are documented in the ED course above.   If decision rules were used in this patient's evaluation, they are listed below.   Click here for ABCD2, HEART and other calculatorsREFRESH Note before signing   Patient's presentation is most consistent with acute presentation with potential threat to life or bodily function.  Medical Decision Making Amount and/or Complexity of Data Reviewed Labs: ordered. Radiology: ordered.    HPI/ROS      See MDM section for pertinent HPI and ROS. A complete ROS was performed with pertinent positives/negatives noted above.   Past Medical History:  Diagnosis Date   Asthma    MS (multiple sclerosis) (HCC)     History reviewed. No pertinent surgical history.    Physical Exam   Vitals:   06/14/23 2030 06/14/23 2145 06/15/23 0007 06/15/23 0010  BP: 134/78   104/82  Pulse: 90 69  77  Resp: 18   16  Temp:   98.4 F (36.9 C)   TempSrc:   Oral   SpO2: 99% 100%  100%  Weight:      Height:  Physical Exam Vitals reviewed.  Constitutional:      General: He is not in acute distress.    Appearance: He is not ill-appearing or toxic-appearing.  HENT:     Head: Normocephalic.  Eyes:     General: Lids are normal. Visual field deficit (Bilateral blurry vision) present.     Pupils: Pupils are equal, round, and reactive to light.  Cardiovascular:     Rate and Rhythm: Normal rate and regular rhythm.  Pulmonary:     Effort:  Pulmonary effort is normal.     Breath sounds: Normal breath sounds.  Abdominal:     General: Abdomen is flat. Bowel sounds are normal. There is no distension. There are no signs of injury.     Palpations: Abdomen is soft.     Tenderness: There is no abdominal tenderness.     Hernia: No hernia is present.  Skin:    General: Skin is warm.  Neurological:     Mental Status: He is alert.     Sensory: Sensory deficit present.     Motor: Motor function is intact. No weakness.      Procedures   If procedures were preformed on this patient, they are listed below:  Procedures   Fayrene Helper, MD Emergency Medicine PGY-2   Please note that this documentation was produced with the assistance of voice-to-text technology and may contain errors.    Fayrene Helper, MD 06/15/23 4098    Loetta Rough, MD 06/17/23 [phone]

## 2023-06-15 ENCOUNTER — Emergency Department (HOSPITAL_COMMUNITY): Payer: Medicaid Other

## 2023-06-15 ENCOUNTER — Other Ambulatory Visit: Payer: Self-pay

## 2023-06-15 ENCOUNTER — Inpatient Hospital Stay (HOSPITAL_COMMUNITY): Payer: Medicaid Other

## 2023-06-15 DIAGNOSIS — Z91013 Allergy to seafood: Secondary | ICD-10-CM | POA: Diagnosis not present

## 2023-06-15 DIAGNOSIS — Z79899 Other long term (current) drug therapy: Secondary | ICD-10-CM | POA: Diagnosis not present

## 2023-06-15 DIAGNOSIS — G35 Multiple sclerosis: Secondary | ICD-10-CM | POA: Diagnosis not present

## 2023-06-15 DIAGNOSIS — J45909 Unspecified asthma, uncomplicated: Secondary | ICD-10-CM | POA: Diagnosis present

## 2023-06-15 MED ORDER — POLYETHYLENE GLYCOL 3350 17 G PO PACK
17.0000 g | PACK | Freq: Every day | ORAL | Status: DC | PRN
  Administered 2023-06-16: 17 g via ORAL
  Filled 2023-06-15: qty 1

## 2023-06-15 MED ORDER — PANTOPRAZOLE SODIUM 40 MG PO TBEC
40.0000 mg | DELAYED_RELEASE_TABLET | Freq: Every day | ORAL | Status: DC
  Administered 2023-06-15 – 2023-06-17 (×3): 40 mg via ORAL
  Filled 2023-06-15 (×3): qty 1

## 2023-06-15 MED ORDER — ENOXAPARIN SODIUM 40 MG/0.4ML IJ SOSY
40.0000 mg | PREFILLED_SYRINGE | Freq: Every day | INTRAMUSCULAR | Status: DC
  Administered 2023-06-15 – 2023-06-17 (×3): 40 mg via SUBCUTANEOUS
  Filled 2023-06-15 (×3): qty 0.4

## 2023-06-15 MED ORDER — ACETAMINOPHEN 325 MG PO TABS: 650.0000 mg | ORAL_TABLET | Freq: Four times a day (QID) | ORAL | Status: DC | PRN

## 2023-06-15 MED ORDER — LACTATED RINGERS IV SOLN: INTRAVENOUS | Status: DC

## 2023-06-15 MED ORDER — BISACODYL 5 MG PO TBEC
5.0000 mg | DELAYED_RELEASE_TABLET | Freq: Every day | ORAL | Status: DC | PRN
  Administered 2023-06-15 – 2023-06-16 (×2): 5 mg via ORAL
  Filled 2023-06-15 (×2): qty 1

## 2023-06-15 MED ORDER — ACETAMINOPHEN 650 MG RE SUPP: 650.0000 mg | Freq: Four times a day (QID) | RECTAL | Status: DC | PRN

## 2023-06-15 MED ORDER — GADOBUTROL 1 MMOL/ML IV SOLN
6.5000 mL | Freq: Once | INTRAVENOUS | Status: AC | PRN
  Administered 2023-06-15: 6.5 mL via INTRAVENOUS

## 2023-06-15 MED ORDER — HYDRALAZINE HCL 20 MG/ML IJ SOLN: 5.0000 mg | INTRAMUSCULAR | Status: DC | PRN

## 2023-06-15 MED ORDER — ONDANSETRON HCL 4 MG PO TABS: 4.0000 mg | ORAL_TABLET | Freq: Four times a day (QID) | ORAL | Status: DC | PRN

## 2023-06-15 MED ORDER — ONDANSETRON HCL 4 MG/2ML IJ SOLN: 4.0000 mg | Freq: Four times a day (QID) | INTRAMUSCULAR | Status: DC | PRN

## 2023-06-15 MED ORDER — SODIUM CHLORIDE 0.9 % IV SOLN
1000.0000 mg | Freq: Every day | INTRAVENOUS | Status: DC
  Administered 2023-06-15 – 2023-06-17 (×3): 1000 mg via INTRAVENOUS
  Filled 2023-06-15 (×3): qty 16

## 2023-06-15 NOTE — ED Notes (Signed)
Pt to MRI

## 2023-06-15 NOTE — Consult Note (Addendum)
Neurology Consultation  Reason for Consult: Neurological symptoms with concern for MS flareup Referring Physician: Dr. Willeen Cass, EDP  CC: Blurred vision and bilateral upper extremity paresthesias worse than baseline over the past few days.  History is obtained from: Chart, patient, patient's mother  HPI: Calvin Ward is a 22 y.o. male past history of recently diagnosed MS with symptoms likely ongoing since 2022, treated for MS exacerbation both clinical and radiographic in June, with follow-up with Dr. Sherol Dade and recommended initiation of of disease modifying agent - Kesimpta (ofatumumab), which she has received but not started because of an ongoing GI illness-vomiting and diarrhea-resolved-now coming in with worsening paresthesias and blurred vision for the past few days. He reports that he has had blurred vision for the last 2 to 3 days.  He was seen in the emergency department on 06/12/2023.  He was prescribed Phenergan for nausea and vomiting which has since subsided but the neurological symptoms have persisted.  He also reports difficulty with speech at times-feels like it is difficult to bring his words out.  Blurred vision is in both eyes.  No aggravating or relieving factors.  I was called for recommendations on the phone-given the fact that he has new/worsening neurological symptoms, with his young age and recent diagnosis of MS with moderate lesion noted in his brain, further imaging be pursued.  MRI of the brain, orbits and C-spine was done-MRI brain with concern for new abnormal enhancement and ongoing active demyelination.  MRI of the C-spine and orbits unremarkable.  I was formally consulted after imaging.  ROS: Full ROS was performed and is negative except as noted in the HPI.   Past Medical History:  Diagnosis Date   Asthma    MS (multiple sclerosis) (HCC)    Family History  Problem Relation Age of Onset   Multiple sclerosis Neg Hx    Social History:   reports that he has  never smoked. He has never used smokeless tobacco. He reports that he does not drink alcohol and does not use drugs.  Medications  Current Facility-Administered Medications:    methylPREDNISolone sodium succinate (SOLU-MEDROL) 1,000 mg in sodium chloride 0.9 % 50 mL IVPB, 1,000 mg, Intravenous, Daily, Antony Madura, PA-C  Current Outpatient Medications:    KESIMPTA 20 MG/0.4ML SOAJ, Inject into the skin., Disp: , Rfl:    promethazine (PHENERGAN) 25 MG tablet, Take 1 tablet (25 mg total) by mouth every 6 (six) hours as needed for nausea or vomiting. (Patient not taking: Reported on 06/14/2023), Disp: 30 tablet, Rfl: 1   predniSONE (DELTASONE) 50 MG tablet, Take 25 tablets (1250 mg) once on Tuesday (Patient not taking: Reported on 05/28/2023), Disp: 25 tablet, Rfl: 0   Vitamin D, Ergocalciferol, (DRISDOL) 1.25 MG (50000 UNIT) CAPS capsule, Take 1 capsule (50,000 Units total) by mouth every 7 (seven) days. (Patient not taking: Reported on 06/14/2023), Disp: 13 capsule, Rfl: 1  Exam: Current vital signs: BP (!) 128/96   Pulse 89   Temp 98.4 F (36.9 C) (Oral)   Resp 18   Ht 5\' 8"  (1.727 m)   Wt 67 kg   SpO2 100%   BMI 22.46 kg/m  Vital signs in last 24 hours: Temp:  [98.2 F (36.8 C)-98.6 F (37 C)] 98.4 F (36.9 C) (07/13 0007) Pulse Rate:  [61-105] 89 (07/13 0100) Resp:  [16-18] 18 (07/13 0100) BP: (93-143)/(54-96) 128/96 (07/13 0100) SpO2:  [99 %-100 %] 100 % (07/13 0100) Weight:  [67 kg] 67 kg (07/12 1506)  GENERAL:  Awake, alert in NAD HEENT: - Normocephalic and atraumatic, dry mm, no LN++, no Thyromegally LUNGS - Clear to auscultation bilaterally with no wheezes CV - S1S2 RRR, no m/r/g, equal pulses bilaterally. ABDOMEN - Soft, nontender, nondistended with normoactive BS Ext: warm, well perfused, intact peripheral pulses,no edema Neurological exam Awake alert oriented x 3.  Speech is mildly dysarthric.  No evidence of aphasia.  Cranial nerves II to XII intact-reports some  blurred vision but no actual diminished acuity.  Motor examination with no drift in any of the 4 extremities.  Sensation examination reveals subjective tingling sensation and patchy numbness in all 4 extremities.  Coordination examination with no dysmetria.  DTRs-hyperreflexic with crossed adductors.   Labs I have reviewed labs in epic and the results pertinent to this consultation are:  CBC    Component Value Date/Time   WBC 5.7 06/14/2023 1851   RBC 5.34 06/14/2023 1851   HGB 16.6 06/14/2023 1851   HCT 47.5 06/14/2023 1851   PLT 434 (H) 06/14/2023 1851   MCV 89.0 06/14/2023 1851   MCH 31.1 06/14/2023 1851   MCHC 34.9 06/14/2023 1851   RDW 11.7 06/14/2023 1851   LYMPHSABS 1.2 05/18/2023 0013   MONOABS 0.2 05/18/2023 0013   EOSABS 0.2 05/18/2023 0013   BASOSABS 0.1 05/18/2023 0013    CMP     Component Value Date/Time   NA 138 06/14/2023 1851   K 3.5 06/14/2023 1851   CL 103 06/14/2023 1851   CO2 25 06/14/2023 1851   GLUCOSE 91 06/14/2023 1851   BUN 12 06/14/2023 1851   CREATININE 1.06 06/14/2023 1851   CALCIUM 9.7 06/14/2023 1851   PROT 7.9 06/14/2023 1851   ALBUMIN 4.6 06/14/2023 1851   AST 19 06/14/2023 1851   ALT 25 06/14/2023 1851   ALKPHOS 67 06/14/2023 1851   BILITOT 1.5 (H) 06/14/2023 1851   GFRNONAA >60 06/14/2023 1851   Imaging I have reviewed the images obtained: MRI examination of the brain with and without contrast: Restricted diffusion in the medulla, right splenium of the corpus callosum and posterior body of the corpus callosum with increased T2 STIR hyperintense signal consistent with active demyelination.  Posterior body of the corpus callosum lesion also demonstrates enhancement.  There are additional T2 lesions noted in the prior exam that have decreased in size but there is new lesion on the left thalamic capsular region that does not enhance or restrict.  Additional contrast-enhancement noted in the anterior aspect of the left frontoparietal lesion  that demonstrated more enhancement on the prior exam but still remains concerning for active demyelination.  MR orbits with and without contrast: No evidence of optic neuritis  MRI C-spine with and without contrast: No evidence of demyelinating disease in the cervical spinal cord.  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  No significant spinal calcinosis or foraminal narrowing.  Small amount of fluid in the proximal trachea  Assessment:  22 year old man with recent diagnosis of MS after he presented with right-sided numbness weakness and clumsiness with disease process likely going down as far back as 2022 and comparative imaging showing evolution of T2 hyperintense lesions with enhancing lesions on the scan in June meeting the McDonald criteria for multiple sclerosis. He seems to have lesions that are either big or coalesced raising concern for tumefactive MS. He was prescribed Kesimpta, but had a GI illness for which she did not start that medication yet.  His symptomatology worsened and he presented to the hospital  for further evaluation.  Brain imaging reveals ongoing after demyelination. I would recommend giving him high-dose steroids for 5 days prior to starting his disease modifying therapy and having this discussion with the outpatient neurologist on Monday.   Impression:  Multiple sclerosis exacerbation Evaluate for [place] variant of MS  Recommendations: I would recommend admission to the hospitalist Start IV steroids-Solu-Medrol 1 g IV daily for 5 days.  Last admission he was given the last 2 days as  p.o. steroids to take at home-patient and the mother do not want that done because of the side effects with p.o. steroids. I would recommend discussing this patient's care and plan with the outpatient MS neurologist-Dr. Epimenio Foot on Monday- specifically consideration for cyclophosphamide or any other modality given that this is a pretty aggressive looking MS  based on his clinical progression and lesion load in the brain.  Also check with him about timing of Kesimpta initiation. Check UA and chest x-ray to rule out any infection or need for antibiotics.  GI prophylaxis while on steroids Supplement vitamin D orally-he has been prescribed vitamin D 50,000 units weekly for 6 months by Dr. Sherol Dade   Plan discussed with patient, and his mother at bedside.  Plan also discussed with Antony Madura PA-C in the ED.  Final plan relayed to Dr. Antionette Char, admitting hospitalist  Neurology will follow   -- Milon Dikes, MD Neurologist [facility] Pager: [phone]

## 2023-06-15 NOTE — H&P (Signed)
History and Physical    Patient: Calvin Ward ZOX:[id number] DOB: July 20, 2001 DOA: 06/14/2023 DOS: the patient was seen and examined on 06/15/2023 PCP: Pcp, No  Patient coming from: Home - lives with mother and children; NOK: Mother, Ryanjames Madson, [phone]   Chief Complaint: Worsening neurologic symptoms  HPI: Calvin Ward is a 22 y.o. male with medical history significant of asthma and recently diagnosed MS who has not yet started treatment presenting with worsening neurologic symptoms.  He reports that he was treated in the hospital for MS exacerbation in June and was discharged with a couple of days of steroids.  He had to wait for DMARD therapy and he finally obtained it last week.  He had a viral gastroenteritis over the last few days (now resolved) and so further delayed starting it.  He noticed more blurriness of his vision and L > R hand tingling and feet tingling and so returned to the ER.      ER Course:  Carryover, per Dr. Antionette Char:  Recent MS diagnosis p/w blurred vision and increased numbness/tingling, has active demyelination on MRI brain. Neuro (Dr. Wilford Corner) has recommended admission for treatment with IV Solu-Medrol x 5 days.      Review of Systems: As mentioned in the history of present illness. All other systems reviewed and are negative. Past Medical History:  Diagnosis Date   Asthma    MS (multiple sclerosis) (HCC)    History reviewed. No pertinent surgical history. Social History:  reports that he has never smoked. He has never used smokeless tobacco. He reports that he does not drink alcohol and does not use drugs.  Allergies  Allergen Reactions   Shellfish Allergy     Family History  Problem Relation Age of Onset   Multiple sclerosis Neg Hx     Prior to Admission medications   Medication Sig Start Date End Date Taking? Authorizing Provider  KESIMPTA 20 MG/0.4ML SOAJ Inject into the skin. 06/06/23  Yes [provider]  promethazine  (PHENERGAN) 25 MG tablet Take 1 tablet (25 mg total) by mouth every 6 (six) hours as needed for nausea or vomiting. Patient not taking: Reported on 06/14/2023 06/12/23   Asa Lente, MD  predniSONE (DELTASONE) 50 MG tablet Take 25 tablets (1250 mg) once on Tuesday Patient not taking: Reported on 05/28/2023 05/20/23   Alberteen Sam, MD  Vitamin D, Ergocalciferol, (DRISDOL) 1.25 MG (50000 UNIT) CAPS capsule Take 1 capsule (50,000 Units total) by mouth every 7 (seven) days. Patient not taking: Reported on 06/14/2023 05/28/23   Asa Lente, MD    Physical Exam: Vitals:   06/15/23 1030 06/15/23 1116 06/15/23 1200 06/15/23 1448  BP: 121/77  (!) 119/96 (!) 140/87  Pulse: 96  90 97  Resp: 16  16 18   Temp:  98.4 F (36.9 C)  98.3 F (36.8 C)  TempSrc:      SpO2: 100%  100% 100%  Weight:      Height:       General:  Appears calm and comfortable and is in NAD Eyes:  EOMI, normal lids, iris ENT:  grossly normal hearing, lips & tongue, mmm Neck:  no LAD, masses or thyromegaly Cardiovascular:  RRR, no m/r/g. No LE edema.  Respiratory:   CTA bilaterally with no wheezes/rales/rhonchi.  Normal respiratory effort. Abdomen:  soft, NT, ND Skin:  no rash or induration seen on limited exam Musculoskeletal:  grossly normal tone BUE/BLE, good ROM, no bony abnormality Psychiatric:  grossly normal mood and  affect, speech fluent and appropriate, AOx3 Neurologic:  CN 2-12 grossly intact, moves all extremities in coordinated fashion   Radiological Exams on Admission: Independently reviewed - see discussion in A/P where applicable  DG Chest 1 View  Result Date: 06/15/2023 CLINICAL DATA:  22 year old male with history of cough. EXAM: CHEST  1 VIEW COMPARISON:  No priors. FINDINGS: Lung volumes are normal. No consolidative airspace disease. No pleural effusions. No pneumothorax. No pulmonary nodule or mass noted. Pulmonary vasculature and the cardiomediastinal silhouette are within normal  limits. IMPRESSION: No radiographic evidence of acute cardiopulmonary disease. Electronically Signed   By: Trudie Reed M.D.   On: 06/15/2023 05:26   MR Cervical Spine W and Wo Contrast  Result Date: 06/15/2023 CLINICAL DATA:  Multiple sclerosis EXAM: MRI CERVICAL SPINE WITHOUT AND WITH CONTRAST TECHNIQUE: Multiplanar and multiecho pulse sequences of the cervical spine, to include the craniocervical junction and cervicothoracic junction, were obtained without and with intravenous contrast. CONTRAST:  6.5mL GADAVIST GADOBUTROL 1 MMOL/ML IV SOLN COMPARISON:  05/17/2023 FINDINGS: Alignment: Straightening of the normal cervical lordosis. No listhesis. Vertebrae: No acute fracture, evidence of discitis, or suspicious osseous lesion. Congenitally short pedicles, which narrow the AP diameter of the spinal canal. Cord: Normal signal and morphology. No abnormal enhancement or T2 hyperintense lesions. Posterior Fossa, vertebral arteries, paraspinal tissues: Small amount of fluid in the proximal trachea (series 7, image 31), which increases the risk of aspiration. Otherwise negative. Disc levels: No significant spinal canal stenosis or neural foraminal narrowing. IMPRESSION: 1. No evidence of demyelinating disease in the cervical spinal cord. 2. Congenitally short pedicles, which narrow the AP diameter of the spinal canal. No significant spinal canal stenosis or neural foraminal narrowing. 3. Small amount of fluid in the proximal trachea, which increases the risk of aspiration. Electronically Signed   By: Wiliam Ke M.D.   On: 06/15/2023 03:43   MR BRAIN W WO CONTRAST  Result Date: 06/15/2023 CLINICAL DATA:  Multiple sclerosis EXAM: MRI HEAD WITHOUT AND WITH CONTRAST TECHNIQUE: Multiplanar, multiecho pulse sequences of the brain and surrounding structures were obtained without and with intravenous contrast. CONTRAST:  6.5mL GADAVIST GADOBUTROL 1 MMOL/ML IV SOLN COMPARISON:  05/17/2023 FINDINGS: Brain: Restricted  diffusion with ADC correlate in the medulla (series 5, image 45). Additional restricted diffusion with ADC correlate in the right splenium of the corpus callosum (series 5, image 62) and posterior body (series 5, image 65). Additional areas of increased signal on diffusion-weighted imaging are correlated with increased signal on ADC, compatible with T2 shine through. Of these, the posterior body lesion is associated with peripheral enhancement (series 19, image 30). The other 2 foci do not demonstrate definite enhancement. All 3 are associated with increased T2 hyperintense signal, with the posterior body and splenium lesions increased in size since the 05/17/2023 exam and the medullary lesion new compared to the prior. Additional contrast enhancement is noted at the anterior aspect of the left frontoparietal lesion that was noted to enhance on the prior exam (series 19, image 32); the current enhancement is less than was present on the prior exam. No other abnormal enhancement. Several of the T2 hyperintense lesions noted on the prior exam of decreased in size. New T2 hyperintense lesion is noted in the left thalamocapsular region (series 16, image 56). No acute hemorrhage, mass, mass effect, or midline shift. No hydrocephalus or extra-axial collection. Normal pituitary and craniocervical junction. No hemosiderin deposition to suggest remote hemorrhage. Normal cerebral volume for age. Vascular: Normal arterial flow voids. Normal  arterial and venous enhancement. Skull and upper cervical spine: Normal marrow signal. Sinuses/Orbits: Minimal mucosal thickening in the ethmoid air cells. Left maxillary mucous retention cyst. Please see same day MRI orbits for orbital findings. Other: The mastoid air cells are well aerated. IMPRESSION: 1. Restricted diffusion in the medulla, right splenium of the corpus callosum, and posterior body of the corpus callosum, with increased T2 hyperintense signal, consistent with active  demyelination. The posterior body of the corpus callosum lesion also demonstrates enhancement. 2. Additional T2 hyperintense lesions noted on the prior exam have decreased in size, but there is a new lesion in the left thalamocapsular region that does not enhance or restrict diffusion. 3. Additional contrast enhancement is noted at the anterior aspect of a left frontoparietal lesion that demonstrated more enhancement on the prior exam, which remains concerning for active demyelination. 4. Please see same day MRI orbits for orbital findings. Electronically Signed   By: Wiliam Ke M.D.   On: 06/15/2023 02:56   MR ORBITS W WO CONTRAST  Result Date: 06/15/2023 CLINICAL DATA:  Optic neuritis suspected EXAM: MRI OF THE ORBITS WITHOUT AND WITH CONTRAST TECHNIQUE: Multiplanar, multi-echo pulse sequences of the orbits and surrounding structures were acquired including fat saturation techniques, before and after intravenous contrast administration. CONTRAST:  6.69mL GADAVIST GADOBUTROL 1 MMOL/ML IV SOLN COMPARISON:  No prior MRI of the orbits, correlation is made with MRI head 05/17/2023 FINDINGS: Orbits: No orbital mass or evidence of inflammation. Normal appearance of the globes, optic nerve-sheath complexes, extraocular muscles, orbital fat and lacrimal glands. No abnormal enhancement. Visualized sinuses: Minimal mucosal thickening in the ethmoid air cells. Mucous retention cyst in the left maxillary sinus. Otherwise clear paranasal sinuses. The mastoids are well aerated. Soft tissues: Normal. Limited intracranial: Please see same day MRI brain. IMPRESSION: Unremarkable MRI of the orbits. No evidence of optic neuritis. Electronically Signed   By: Wiliam Ke M.D.   On: 06/15/2023 02:42    EKG: Independently reviewed.  Sinus tachycardia with rate 104; nonspecific ST changes with no evidence of acute ischemia   Labs on Admission: I have personally reviewed the available labs and imaging studies at the time of  the admission.  Pertinent labs:    Unremarkable CMP Platelets 434   Assessment and Plan: Principal Problem:   Exacerbation of multiple sclerosis (HCC)    MS Exacerbation -Patient with what appears to be an aggressive variant of MS - although this is exacerbated by his not yet starting therapy -MRI with acute demyelination with new lesions from prior -Will admit to Med Surg -Will treat with 1 gram Solumedrol IV daily x 5 days -Needs empiric PPI while on steroids -Neurology consultation is appreciated -Physical/occupational therapy consults.   -He has been prescribed disease-modifying treatment and needs to start Kesimpta at time of dc unless Dr. Epimenio Foot suggests otherwise (primary team encouraged to contact him Monday to discuss further) -MRI cervical spine w/wo contrast also requested by neurology, unremarkable  Social -On discussion, he appears to have few outside hobbies and mostly hangs out at home inside all day -He also dropped out of high school during COVID -We discussed this and he is encouraged to try to get his degree/GED and come up with some hobbies/interests     Advance Care Planning:   Code Status: Full Code   Consults: Neurology, nutrition, PT/OT  DVT Prophylaxis: Lovenox  Family Communication: None present  Severity of Illness: The appropriate patient status for this patient is INPATIENT. Inpatient status is judged to  be reasonable and necessary in order to provide the required intensity of service to ensure the patient's safety. The patient's presenting symptoms, physical exam findings, and initial radiographic and laboratory data in the context of their chronic comorbidities is felt to place them at high risk for further clinical deterioration. Furthermore, it is not anticipated that the patient will be medically stable for discharge from the hospital within 2 midnights of admission.   * I certify that at the point of admission it is my clinical judgment  that the patient will require inpatient hospital care spanning beyond 2 midnights from the point of admission due to high intensity of service, high risk for further deterioration and high frequency of surveillance required.*  Author: Jonah Blue, MD 06/15/2023 6:08 PM  For on call review [email or web address].

## 2023-06-15 NOTE — ED Provider Notes (Signed)
3:25 AM MRIs completed. MD Wilford Corner of neurology to review MRI imaging and provide disposition recommendations.  3:28 AM MD Wilford Corner to assess patient at bedside.  3:38 AM Patient updated on plan for bedside Neurology consultation. All questions answered. No acute complaints.  3:59 AM Patient assessed by MD Wilford Corner who recommends hospitalist admission and initiation of IV steroids. 1g Solumedrol QD x 5 days ordered.  4:15 AM Case discussed with Dr. Antionette Char who will admit.   MR BRAIN W WO CONTRAST  Result Date: 06/15/2023 CLINICAL DATA:  Multiple sclerosis EXAM: MRI HEAD WITHOUT AND WITH CONTRAST TECHNIQUE: Multiplanar, multiecho pulse sequences of the brain and surrounding structures were obtained without and with intravenous contrast. CONTRAST:  6.44mL GADAVIST GADOBUTROL 1 MMOL/ML IV SOLN COMPARISON:  05/17/2023 FINDINGS: Brain: Restricted diffusion with ADC correlate in the medulla (series 5, image 45). Additional restricted diffusion with ADC correlate in the right splenium of the corpus callosum (series 5, image 62) and posterior body (series 5, image 65). Additional areas of increased signal on diffusion-weighted imaging are correlated with increased signal on ADC, compatible with T2 shine through. Of these, the posterior body lesion is associated with peripheral enhancement (series 19, image 30). The other 2 foci do not demonstrate definite enhancement. All 3 are associated with increased T2 hyperintense signal, with the posterior body and splenium lesions increased in size since the 05/17/2023 exam and the medullary lesion new compared to the prior. Additional contrast enhancement is noted at the anterior aspect of the left frontoparietal lesion that was noted to enhance on the prior exam (series 19, image 32); the current enhancement is less than was present on the prior exam. No other abnormal enhancement. Several of the T2 hyperintense lesions noted on the prior exam of decreased in size. New T2  hyperintense lesion is noted in the left thalamocapsular region (series 16, image 56). No acute hemorrhage, mass, mass effect, or midline shift. No hydrocephalus or extra-axial collection. Normal pituitary and craniocervical junction. No hemosiderin deposition to suggest remote hemorrhage. Normal cerebral volume for age. Vascular: Normal arterial flow voids. Normal arterial and venous enhancement. Skull and upper cervical spine: Normal marrow signal. Sinuses/Orbits: Minimal mucosal thickening in the ethmoid air cells. Left maxillary mucous retention cyst. Please see same day MRI orbits for orbital findings. Other: The mastoid air cells are well aerated. IMPRESSION: 1. Restricted diffusion in the medulla, right splenium of the corpus callosum, and posterior body of the corpus callosum, with increased T2 hyperintense signal, consistent with active demyelination. The posterior body of the corpus callosum lesion also demonstrates enhancement. 2. Additional T2 hyperintense lesions noted on the prior exam have decreased in size, but there is a new lesion in the left thalamocapsular region that does not enhance or restrict diffusion. 3. Additional contrast enhancement is noted at the anterior aspect of a left frontoparietal lesion that demonstrated more enhancement on the prior exam, which remains concerning for active demyelination. 4. Please see same day MRI orbits for orbital findings. Electronically Signed   By: Wiliam Ke M.D.   On: 06/15/2023 02:56   MR ORBITS W WO CONTRAST  Result Date: 06/15/2023 CLINICAL DATA:  Optic neuritis suspected EXAM: MRI OF THE ORBITS WITHOUT AND WITH CONTRAST TECHNIQUE: Multiplanar, multi-echo pulse sequences of the orbits and surrounding structures were acquired including fat saturation techniques, before and after intravenous contrast administration. CONTRAST:  6.61mL GADAVIST GADOBUTROL 1 MMOL/ML IV SOLN COMPARISON:  No prior MRI of the orbits, correlation is made with MRI head  05/17/2023 FINDINGS:  Orbits: No orbital mass or evidence of inflammation. Normal appearance of the globes, optic nerve-sheath complexes, extraocular muscles, orbital fat and lacrimal glands. No abnormal enhancement. Visualized sinuses: Minimal mucosal thickening in the ethmoid air cells. Mucous retention cyst in the left maxillary sinus. Otherwise clear paranasal sinuses. The mastoids are well aerated. Soft tissues: Normal. Limited intracranial: Please see same day MRI brain. IMPRESSION: Unremarkable MRI of the orbits. No evidence of optic neuritis. Electronically Signed   By: Wiliam Ke M.D.   On: 06/15/2023 02:42   MR Brain W and Wo Contrast  Result Date: 05/17/2023 CLINICAL DATA:  Right-sided paresthesias, concern for stroke versus multiple sclerosis EXAM: MRI HEAD WITHOUT AND WITH CONTRAST MRI CERVICAL SPINE WITHOUT AND WITH CONTRAST TECHNIQUE: Multiplanar, multiecho pulse sequences of the brain and surrounding structures, and cervical spine, to include the craniocervical junction and cervicothoracic junction, were obtained without and with intravenous contrast. CONTRAST:  6.97mL GADAVIST GADOBUTROL 1 MMOL/ML IV SOLN COMPARISON:  06/17/2021 MRI head and cervical spine FINDINGS: MRI HEAD FINDINGS Brain: Numerous T2 hyperintense lesions in the bilateral periventricular and right juxtacortical white matter, with additional infratentorial lesion in the right cerebellum, which are new from the prior exam; a lesion adjacent to the right occipital horn was likely present on the prior exam. The largest of these lesions measures 1.8 x 1.6 x 1.5 cm (AP x TR x CC) (series 6, image 24 and series 8, image 80), and is associated with heterogeneous contrast enhancement (series 20, image 37). Other lesions in the bilateral frontal lobes, right parietal lobe, bilateral occipital lobes, bilateral basal ganglia, posterior body and splenium of the corpus callosum, and right cerebellum do not demonstrate associated  enhancement. A lesion in the right frontal white matter (series 2, image 34) has a mild ADC correlate, as does a lesion right splenium of the corpus callosum (series 2, image 26). There is a rim of restricted diffusion with ADC correlate at the anterior aspect of the largest lesion, in the left frontoparietal region (series 2, image 33 and series 250, image 33). No acute hemorrhage, mass, mass effect, or midline shift. No hydrocephalus or extra-axial collection. Normal pituitary and craniocervical junction. No hemosiderin deposition to suggest remote hemorrhage. Normal cerebral volume for age. Vascular: Normal arterial flow voids. Normal arterial and venous enhancement. Skull and upper cervical spine: Normal marrow signal. Sinuses/Orbits: Clear paranasal sinuses. No acute finding in the orbits. MRI CERVICAL SPINE FINDINGS Alignment: No listhesis. Vertebrae: No acute fracture, evidence of discitis, or suspicious osseous lesion. Congenitally short pedicles, which narrow the AP diameter of the spinal canal. Cord: Normal signal and morphology.  No abnormal enhancement. Posterior Fossa, vertebral arteries, paraspinal tissues: Negative. Disc levels: No significant spinal canal stenosis or neural foraminal narrowing. IMPRESSION: 1. Numerous T2 hyperintense lesions in the bilateral cerebral hemispheres and right cerebellum, new from the prior MRI, consistent with multiple sclerosis. The majority of these lesions do not enhance and likely do not represent active demyelination, but the largest lesion, which measures up to 1.8 cm and is in the left frontal parietal white matter, is associated with heterogeneous enhancement and has a rim of restricted diffusion at the anterior aspect, likely an active tumefactive demyelinating lesion. 2. No abnormal signal or enhancement in the cervical spinal cord. 3. Congenitally short pedicles, which narrow the AP diameter of the spinal canal. No significant spinal canal stenosis or neural  foraminal narrowing. These results were called by telephone at the time of interpretation on 05/17/2023 at 9:36 pm to provider  BUTLER , who verbally acknowledged these results. Electronically Signed   By: Wiliam Ke M.D.   On: 05/17/2023 21:37   MR Cervical Spine W and Wo Contrast  Result Date: 05/17/2023 CLINICAL DATA:  Right-sided paresthesias, concern for stroke versus multiple sclerosis EXAM: MRI HEAD WITHOUT AND WITH CONTRAST MRI CERVICAL SPINE WITHOUT AND WITH CONTRAST TECHNIQUE: Multiplanar, multiecho pulse sequences of the brain and surrounding structures, and cervical spine, to include the craniocervical junction and cervicothoracic junction, were obtained without and with intravenous contrast. CONTRAST:  6.31mL GADAVIST GADOBUTROL 1 MMOL/ML IV SOLN COMPARISON:  06/17/2021 MRI head and cervical spine FINDINGS: MRI HEAD FINDINGS Brain: Numerous T2 hyperintense lesions in the bilateral periventricular and right juxtacortical white matter, with additional infratentorial lesion in the right cerebellum, which are new from the prior exam; a lesion adjacent to the right occipital horn was likely present on the prior exam. The largest of these lesions measures 1.8 x 1.6 x 1.5 cm (AP x TR x CC) (series 6, image 24 and series 8, image 80), and is associated with heterogeneous contrast enhancement (series 20, image 37). Other lesions in the bilateral frontal lobes, right parietal lobe, bilateral occipital lobes, bilateral basal ganglia, posterior body and splenium of the corpus callosum, and right cerebellum do not demonstrate associated enhancement. A lesion in the right frontal white matter (series 2, image 34) has a mild ADC correlate, as does a lesion right splenium of the corpus callosum (series 2, image 26). There is a rim of restricted diffusion with ADC correlate at the anterior aspect of the largest lesion, in the left frontoparietal region (series 2, image 33 and series 250, image 33). No acute  hemorrhage, mass, mass effect, or midline shift. No hydrocephalus or extra-axial collection. Normal pituitary and craniocervical junction. No hemosiderin deposition to suggest remote hemorrhage. Normal cerebral volume for age. Vascular: Normal arterial flow voids. Normal arterial and venous enhancement. Skull and upper cervical spine: Normal marrow signal. Sinuses/Orbits: Clear paranasal sinuses. No acute finding in the orbits. MRI CERVICAL SPINE FINDINGS Alignment: No listhesis. Vertebrae: No acute fracture, evidence of discitis, or suspicious osseous lesion. Congenitally short pedicles, which narrow the AP diameter of the spinal canal. Cord: Normal signal and morphology.  No abnormal enhancement. Posterior Fossa, vertebral arteries, paraspinal tissues: Negative. Disc levels: No significant spinal canal stenosis or neural foraminal narrowing. IMPRESSION: 1. Numerous T2 hyperintense lesions in the bilateral cerebral hemispheres and right cerebellum, new from the prior MRI, consistent with multiple sclerosis. The majority of these lesions do not enhance and likely do not represent active demyelination, but the largest lesion, which measures up to 1.8 cm and is in the left frontal parietal white matter, is associated with heterogeneous enhancement and has a rim of restricted diffusion at the anterior aspect, likely an active tumefactive demyelinating lesion. 2. No abnormal signal or enhancement in the cervical spinal cord. 3. Congenitally short pedicles, which narrow the AP diameter of the spinal canal. No significant spinal canal stenosis or neural foraminal narrowing. These results were called by telephone at the time of interpretation on 05/17/2023 at 9:36 pm to provider BUTLER , who verbally acknowledged these results. Electronically Signed   By: Wiliam Ke M.D.   On: 05/17/2023 21:37      Antony Madura, Cordelia Poche 06/15/23 0415    Tilden Fossa, MD 06/15/23 [phone]

## 2023-06-15 NOTE — Plan of Care (Signed)

## 2023-06-15 NOTE — ED Notes (Signed)
ED TO INPATIENT HANDOFF REPORT  ED Nurse Name and Phone #: Morrie Sheldon [phone]  S Name/Age/Gender Calvin Ward 21 y.o. male Room/Bed: 004C/004C  Code Status   Code Status: Full Code  Home/SNF/Other Home Patient oriented to: self, place, time, and situation Is this baseline? Yes   Triage Complete: Triage complete  Chief Complaint Exacerbation of multiple sclerosis (HCC) [G35]  Triage Note Abd pain  since Wednesday   c/o dizziness  new diagnosis of ms   Allergies Allergies  Allergen Reactions   Shellfish Allergy     Level of Care/Admitting Diagnosis ED Disposition     ED Disposition  Admit   Condition  --   Comment  Hospital Area: [facility] [[id number]]  Level of Care: Med-Surg [16]  May admit patient to [facility] or [facility] if equivalent level of care is available:: No  Covid Evaluation: Asymptomatic - no recent exposure (last 10 days) testing not required  Diagnosis: Exacerbation of multiple sclerosis [facility]) [[id number]]  Admitting Physician: Briscoe Deutscher [[id number]]  Attending Physician: Briscoe Deutscher [[id number]]  Certification:: I certify this patient will need inpatient services for at least 2 midnights  Estimated Length of Stay: 3          B Medical/Surgery History Past Medical History:  Diagnosis Date   Asthma    MS (multiple sclerosis) (HCC)    History reviewed. No pertinent surgical history.   A IV Location/Drains/Wounds Patient Lines/Drains/Airways Status     Active Line/Drains/Airways     Name Placement date Placement time Site Days   Peripheral IV 06/14/23 20 G Left Antecubital 06/14/23  1922  Antecubital  1            Intake/Output Last 24 hours No intake or output data in the 24 hours ending 06/15/23 1350  Labs/Imaging Results for orders placed or performed during the hospital encounter of 06/14/23 (from the past 48 hour(s))  Lipase, blood     Status: None   Collection Time: 06/14/23  6:51 PM  Result  Value Ref Range   Lipase 33 11 - 51 U/L    Comment: Performed at [facility], [address], [place], [place] [address]  Comprehensive metabolic panel     Status: Abnormal   Collection Time: 06/14/23  6:51 PM  Result Value Ref Range   Sodium 138 135 - 145 mmol/L   Potassium 3.5 3.5 - 5.1 mmol/L   Chloride 103 98 - 111 mmol/L   CO2 25 22 - 32 mmol/L   Glucose, Bld 91 70 - 99 mg/dL    Comment: Glucose reference range applies only to samples taken after fasting for at least 8 hours.   BUN 12 6 - 20 mg/dL   Creatinine, Ser 3.47 0.61 - 1.24 mg/dL   Calcium 9.7 8.9 - 42.5 mg/dL   Total Protein 7.9 6.5 - 8.1 g/dL   Albumin 4.6 3.5 - 5.0 g/dL   AST 19 15 - 41 U/L   ALT 25 0 - 44 U/L   Alkaline Phosphatase 67 38 - 126 U/L   Total Bilirubin 1.5 (H) 0.3 - 1.2 mg/dL   GFR, Estimated >95 >63 mL/min    Comment: (NOTE) Calculated using the CKD-EPI Creatinine Equation (2021)    Anion gap 10 5 - 15    Comment: Performed at [facility], [address] [place], [place] [address]  CBC     Status: Abnormal   Collection Time: 06/14/23  6:51 PM  Result Value Ref Range   WBC 5.7 4.0 - 10.5 K/uL   RBC 5.34 4.22 - 5.81 MIL/uL   Hemoglobin 16.6 13.0 - 17.0 g/dL   HCT 16.1 09.6 - 04.5 %   MCV 89.0 80.0 - 100.0 fL   MCH 31.1 26.0 - 34.0 pg   MCHC 34.9 30.0 - 36.0 g/dL   RDW 40.9 81.1 - 91.4 %   Platelets 434 (H) 150 - 400 K/uL   nRBC 0.0 0.0 - 0.2 %    Comment: Performed at [facility], [address], [place], [place] [address]  Magnesium     Status: None   Collection Time: 06/14/23  6:51 PM  Result Value Ref Range   Magnesium 2.2 1.7 - 2.4 mg/dL    Comment: Performed at [facility], [address], [place], [place] [address]   DG Chest 1 View  Result Date: 06/15/2023 CLINICAL DATA:  22 year old male with history of cough. EXAM: CHEST  1 VIEW COMPARISON:  No priors. FINDINGS: Lung volumes are normal. No consolidative airspace disease. No pleural effusions. No  pneumothorax. No pulmonary nodule or mass noted. Pulmonary vasculature and the cardiomediastinal silhouette are within normal limits. IMPRESSION: No radiographic evidence of acute cardiopulmonary disease. Electronically Signed   By: Trudie Reed M.D.   On: 06/15/2023 05:26   MR Cervical Spine W and Wo Contrast  Result Date: 06/15/2023 CLINICAL DATA:  Multiple sclerosis EXAM: MRI CERVICAL SPINE WITHOUT AND WITH CONTRAST TECHNIQUE: Multiplanar and multiecho pulse sequences of the cervical spine, to include the craniocervical junction and cervicothoracic junction, were obtained without and with intravenous contrast. CONTRAST:  6.78mL GADAVIST GADOBUTROL 1 MMOL/ML IV SOLN COMPARISON:  05/17/2023 FINDINGS: Alignment: Straightening of the normal cervical lordosis. No listhesis. Vertebrae: No acute fracture, evidence of discitis, or suspicious osseous lesion. Congenitally short pedicles, which narrow the AP diameter of the spinal canal. Cord: Normal signal and morphology. No abnormal enhancement or T2 hyperintense lesions. Posterior Fossa, vertebral arteries, paraspinal tissues: Small amount of fluid in the proximal trachea (series 7, image 31), which increases the risk of aspiration. Otherwise negative. Disc levels: No significant spinal canal stenosis or neural foraminal narrowing. IMPRESSION: 1. No evidence of demyelinating disease in the cervical spinal cord. 2. Congenitally short pedicles, which narrow the AP diameter of the spinal canal. No significant spinal canal stenosis or neural foraminal narrowing. 3. Small amount of fluid in the proximal trachea, which increases the risk of aspiration. Electronically Signed   By: Wiliam Ke M.D.   On: 06/15/2023 03:43   MR BRAIN W WO CONTRAST  Result Date: 06/15/2023 CLINICAL DATA:  Multiple sclerosis EXAM: MRI HEAD WITHOUT AND WITH CONTRAST TECHNIQUE: Multiplanar, multiecho pulse sequences of the brain and surrounding structures were obtained without and with  intravenous contrast. CONTRAST:  6.60mL GADAVIST GADOBUTROL 1 MMOL/ML IV SOLN COMPARISON:  05/17/2023 FINDINGS: Brain: Restricted diffusion with ADC correlate in the medulla (series 5, image 45). Additional restricted diffusion with ADC correlate in the right splenium of the corpus callosum (series 5, image 62) and posterior body (series 5, image 65). Additional areas of increased signal on diffusion-weighted imaging are correlated with increased signal on ADC, compatible with T2 shine through. Of these, the posterior body lesion is associated with peripheral enhancement (series 19, image 30). The other 2 foci do not demonstrate definite enhancement. All 3 are associated with increased T2 hyperintense signal, with the posterior body and splenium lesions increased in size since the 05/17/2023 exam and the medullary  lesion new compared to the prior. Additional contrast enhancement is noted at the anterior aspect of the left frontoparietal lesion that was noted to enhance on the prior exam (series 19, image 32); the current enhancement is less than was present on the prior exam. No other abnormal enhancement. Several of the T2 hyperintense lesions noted on the prior exam of decreased in size. New T2 hyperintense lesion is noted in the left thalamocapsular region (series 16, image 56). No acute hemorrhage, mass, mass effect, or midline shift. No hydrocephalus or extra-axial collection. Normal pituitary and craniocervical junction. No hemosiderin deposition to suggest remote hemorrhage. Normal cerebral volume for age. Vascular: Normal arterial flow voids. Normal arterial and venous enhancement. Skull and upper cervical spine: Normal marrow signal. Sinuses/Orbits: Minimal mucosal thickening in the ethmoid air cells. Left maxillary mucous retention cyst. Please see same day MRI orbits for orbital findings. Other: The mastoid air cells are well aerated. IMPRESSION: 1. Restricted diffusion in the medulla, right splenium of  the corpus callosum, and posterior body of the corpus callosum, with increased T2 hyperintense signal, consistent with active demyelination. The posterior body of the corpus callosum lesion also demonstrates enhancement. 2. Additional T2 hyperintense lesions noted on the prior exam have decreased in size, but there is a new lesion in the left thalamocapsular region that does not enhance or restrict diffusion. 3. Additional contrast enhancement is noted at the anterior aspect of a left frontoparietal lesion that demonstrated more enhancement on the prior exam, which remains concerning for active demyelination. 4. Please see same day MRI orbits for orbital findings. Electronically Signed   By: Wiliam Ke M.D.   On: 06/15/2023 02:56   MR ORBITS W WO CONTRAST  Result Date: 06/15/2023 CLINICAL DATA:  Optic neuritis suspected EXAM: MRI OF THE ORBITS WITHOUT AND WITH CONTRAST TECHNIQUE: Multiplanar, multi-echo pulse sequences of the orbits and surrounding structures were acquired including fat saturation techniques, before and after intravenous contrast administration. CONTRAST:  6.31mL GADAVIST GADOBUTROL 1 MMOL/ML IV SOLN COMPARISON:  No prior MRI of the orbits, correlation is made with MRI head 05/17/2023 FINDINGS: Orbits: No orbital mass or evidence of inflammation. Normal appearance of the globes, optic nerve-sheath complexes, extraocular muscles, orbital fat and lacrimal glands. No abnormal enhancement. Visualized sinuses: Minimal mucosal thickening in the ethmoid air cells. Mucous retention cyst in the left maxillary sinus. Otherwise clear paranasal sinuses. The mastoids are well aerated. Soft tissues: Normal. Limited intracranial: Please see same day MRI brain. IMPRESSION: Unremarkable MRI of the orbits. No evidence of optic neuritis. Electronically Signed   By: Wiliam Ke M.D.   On: 06/15/2023 02:42    Pending Labs Unresulted Labs (From admission, onward)     Start     Ordered   06/16/23 0500  Basic  metabolic panel  Tomorrow morning,   R        06/15/23 0930   06/16/23 0500  CBC  Tomorrow morning,   R        06/15/23 0930            Vitals/Pain Today's Vitals   06/15/23 0830 06/15/23 1030 06/15/23 1116 06/15/23 1200  BP: 128/73 121/77  (!) 119/96  Pulse: 76 96  90  Resp: 16 16  16   Temp:   98.4 F (36.9 C)   TempSrc:      SpO2: 100% 100%  100%  Weight:      Height:      PainSc:        Isolation Precautions  No active isolations  Medications Medications  methylPREDNISolone sodium succinate (SOLU-MEDROL) 1,000 mg in sodium chloride 0.9 % 50 mL IVPB (0 mg Intravenous Stopped 06/15/23 0705)  pantoprazole (PROTONIX) EC tablet 40 mg (40 mg Oral Given 06/15/23 0907)  enoxaparin (LOVENOX) injection 40 mg (40 mg Subcutaneous Given 06/15/23 1006)  lactated ringers infusion ( Intravenous New Bag/Given 06/15/23 1009)  acetaminophen (TYLENOL) tablet 650 mg (has no administration in time range)    Or  acetaminophen (TYLENOL) suppository 650 mg (has no administration in time range)  polyethylene glycol (MIRALAX / GLYCOLAX) packet 17 g (has no administration in time range)  bisacodyl (DULCOLAX) EC tablet 5 mg (has no administration in time range)  ondansetron (ZOFRAN) tablet 4 mg (has no administration in time range)    Or  ondansetron (ZOFRAN) injection 4 mg (has no administration in time range)  hydrALAZINE (APRESOLINE) injection 5 mg (has no administration in time range)  lactated ringers bolus 1,000 mL (0 mLs Intravenous Stopped 06/15/23 0114)  gadobutrol (GADAVIST) 1 MMOL/ML injection 6.5 mL (6.5 mLs Intravenous Contrast Given 06/15/23 0203)    Mobility walks     Focused Assessments Pulmonary Assessment Handoff:  Lung sounds:   O2 Device: Room Air      R Recommendations: See Admitting Provider Note  Report given to:   Additional Notes:

## 2023-06-16 DIAGNOSIS — G35 Multiple sclerosis: Secondary | ICD-10-CM | POA: Diagnosis not present

## 2023-06-16 LAB — BASIC METABOLIC PANEL
Anion gap: 8 (ref 5–15)
BUN: 9 mg/dL (ref 6–20)
CO2: 24 mmol/L (ref 22–32)
Calcium: 9.2 mg/dL (ref 8.9–10.3)
Chloride: 102 mmol/L (ref 98–111)
Creatinine, Ser: 0.88 mg/dL (ref 0.61–1.24)
GFR, Estimated: >60 mL/min (ref >60)
Glucose, Bld: 125 mg/dL — ABNORMAL HIGH (ref 70–99)
Potassium: 3.6 mmol/L (ref 3.5–5.1)
Sodium: 134 mmol/L — ABNORMAL LOW (ref 135–145)

## 2023-06-16 LAB — CBC
HCT: 37.2 % — ABNORMAL LOW (ref 39.0–52.0)
Hemoglobin: 13.7 g/dL (ref 13.0–17.0)
MCH: 31.1 pg (ref 26.0–34.0)
MCHC: 36.8 g/dL — ABNORMAL HIGH (ref 30.0–36.0)
MCV: 84.4 fL (ref 80.0–100.0)
Platelets: 439 10*3/uL — ABNORMAL HIGH (ref 150–400)
RBC: 4.41 MIL/uL (ref 4.22–5.81)
RDW: 11.5 % (ref 11.5–15.5)
WBC: 10.7 10*3/uL — ABNORMAL HIGH (ref 4.0–10.5)
nRBC: 0 % (ref 0.0–0.2)

## 2023-06-16 LAB — GLUCOSE, CAPILLARY: Glucose-Capillary: 93 mg/dL (ref 70–99)

## 2023-06-16 MED ORDER — VITAMIN D (ERGOCALCIFEROL) 1.25 MG (50000 UNIT) PO CAPS
50000.0000 [IU] | ORAL_CAPSULE | ORAL | Status: DC
  Administered 2023-06-16: 50000 [IU] via ORAL
  Filled 2023-06-16: qty 1

## 2023-06-16 MED ORDER — ADULT MULTIVITAMIN W/MINERALS CH
1.0000 | ORAL_TABLET | Freq: Every day | ORAL | Status: DC
  Administered 2023-06-16 – 2023-06-17 (×2): 1 via ORAL
  Filled 2023-06-16 (×2): qty 1

## 2023-06-16 MED ORDER — ENSURE ENLIVE PO LIQD
237.0000 mL | Freq: Two times a day (BID) | ORAL | Status: DC
  Administered 2023-06-16 – 2023-06-17 (×3): 237 mL via ORAL

## 2023-06-16 NOTE — Progress Notes (Signed)
PROGRESS NOTE    Calvin Ward  WNU:[id number] DOB: December 16, 2000 DOA: 06/14/2023 PCP: Oneita Hurt, No   Brief Narrative:  Calvin Ward is a 22 y.o. male with medical history significant of asthma and recently diagnosed MS who has not yet started treatment presenting with worsening neurologic symptoms including blurry vision left greater than right with diffuse extremity paresthesias.  Hospitalist called for admission, neurology called in consult.  Assessment & Plan:   Principal Problem:   Exacerbation of multiple sclerosis (HCC)   MS Exacerbation -Recently diagnosed, has not yet initiated therapy -MRI shows new lesion compared to prior -Neurology following recommend IV steroids, 1 g Solu-Medrol x 5 days, PPI, vitamin D supplementation -Plan to reach out to patient's primary neurologist Monday morning if possible on 7/15 in regards to Kesimpta (Dr. Epimenio Foot is primary neurologist)  History of asthma Well controlled - follow clinically  DVT prophylaxis: enoxaparin (LOVENOX) injection 40 mg Start: 06/15/23 1100 Code Status:   Code Status: Full Code Family Communication: None present  Status is: Inpatient  Dispo: The patient is from: Home              Anticipated d/c is to: Home              Anticipated d/c date is: 48 to 72 hours pending clinical course and steroid taper              Patient currently not medically stable for discharge given ongoing need for IV steroids for 5 days and is otherwise specified by primary neurology team  Consultants:  Neurology  Procedures:  None  Antimicrobials:  None indicated  Subjective: No acute issues or events overnight, paresthesias and vision changes appear to be resolving but not yet back to baseline.  Otherwise denies nausea vomiting diarrhea constipation any fevers chills chest pain  Objective: Vitals:   06/15/23 1448 06/15/23 1926 06/16/23 0427 06/16/23 0659  BP: (!) 140/87 123/80 (!) 97/48 118/71  Pulse: 97 88 73 61  Resp: 18 17  17 16   Temp: 98.3 F (36.8 C) 99.2 F (37.3 C) 98.3 F (36.8 C) 98.3 F (36.8 C)  TempSrc:  Oral  Oral  SpO2: 100% 100% 97% 100%  Weight:      Height:        Intake/Output Summary (Last 24 hours) at 06/16/2023 0757 Last data filed at 06/16/2023 0057 Gross per 24 hour  Intake 2586.18 ml  Output --  Net 2586.18 ml   Filed Weights   06/14/23 1506  Weight: 67 kg    Examination:  General:  Pleasantly resting in bed, No acute distress. Lungs:  Clear to auscultate bilaterally without rhonchi, wheeze, or rales. Heart:  Regular rate and rhythm.  Without murmurs, rubs, or gallops. Abdomen:  Soft, nontender, nondistended. Extremities: Without neurosensory deficits   Data Reviewed: I have personally reviewed following labs and imaging studies  CBC: Recent Labs  Lab 06/12/23 1732 06/14/23 1851 06/16/23 0210  WBC 15.6* 5.7 10.7*  HGB 14.9 16.6 13.7  HCT 42.1 47.5 37.2*  MCV 87.2 89.0 84.4  PLT 408* 434* 439*   Basic Metabolic Panel: Recent Labs  Lab 06/12/23 1732 06/14/23 1851 06/16/23 0210  NA 138 138 134*  K 4.3 3.5 3.6  CL 102 103 102  CO2 25 25 24   GLUCOSE 146* 91 125*  BUN 11 12 9   CREATININE 0.88 1.06 0.88  CALCIUM 10.0 9.7 9.2  MG  --  2.2  --    GFR: Estimated Creatinine Clearance: 126.9 mL/min (  by C-G formula based on SCr of 0.88 mg/dL). Liver Function Tests: Recent Labs  Lab 06/12/23 1732 06/14/23 1851  AST 17 19  ALT 27 25  ALKPHOS 60 67  BILITOT 0.8 1.5*  PROT 7.6 7.9  ALBUMIN 4.9 4.6   Recent Labs  Lab 06/12/23 1732 06/14/23 1851  LIPASE <10* 33   No results for input(s): "AMMONIA" in the last 168 hours. Coagulation Profile: No results for input(s): "INR", "PROTIME" in the last 168 hours. Cardiac Enzymes: No results for input(s): "CKTOTAL", "CKMB", "CKMBINDEX", "TROPONINI" in the last 168 hours. BNP (last 3 results) No results for input(s): "PROBNP" in the last 8760 hours. HbA1C: No results for input(s): "HGBA1C" in the last 72  hours. CBG: Recent Labs  Lab 06/16/23 0702  GLUCAP 93   Lipid Profile: No results for input(s): "CHOL", "HDL", "LDLCALC", "TRIG", "CHOLHDL", "LDLDIRECT" in the last 72 hours. Thyroid Function Tests: No results for input(s): "TSH", "T4TOTAL", "FREET4", "T3FREE", "THYROIDAB" in the last 72 hours. Anemia Panel: No results for input(s): "VITAMINB12", "FOLATE", "FERRITIN", "TIBC", "IRON", "RETICCTPCT" in the last 72 hours. Sepsis Labs: No results for input(s): "PROCALCITON", "LATICACIDVEN" in the last 168 hours.  No results found for this or any previous visit (from the past 240 hour(s)).       Radiology Studies: DG Chest 1 View  Result Date: 06/15/2023 CLINICAL DATA:  22 year old male with history of cough. EXAM: CHEST  1 VIEW COMPARISON:  No priors. FINDINGS: Lung volumes are normal. No consolidative airspace disease. No pleural effusions. No pneumothorax. No pulmonary nodule or mass noted. Pulmonary vasculature and the cardiomediastinal silhouette are within normal limits. IMPRESSION: No radiographic evidence of acute cardiopulmonary disease. Electronically Signed   By: Trudie Reed M.D.   On: 06/15/2023 05:26   MR Cervical Spine W and Wo Contrast  Result Date: 06/15/2023 CLINICAL DATA:  Multiple sclerosis EXAM: MRI CERVICAL SPINE WITHOUT AND WITH CONTRAST TECHNIQUE: Multiplanar and multiecho pulse sequences of the cervical spine, to include the craniocervical junction and cervicothoracic junction, were obtained without and with intravenous contrast. CONTRAST:  6.23mL GADAVIST GADOBUTROL 1 MMOL/ML IV SOLN COMPARISON:  05/17/2023 FINDINGS: Alignment: Straightening of the normal cervical lordosis. No listhesis. Vertebrae: No acute fracture, evidence of discitis, or suspicious osseous lesion. Congenitally short pedicles, which narrow the AP diameter of the spinal canal. Cord: Normal signal and morphology. No abnormal enhancement or T2 hyperintense lesions. Posterior Fossa, vertebral  arteries, paraspinal tissues: Small amount of fluid in the proximal trachea (series 7, image 31), which increases the risk of aspiration. Otherwise negative. Disc levels: No significant spinal canal stenosis or neural foraminal narrowing. IMPRESSION: 1. No evidence of demyelinating disease in the cervical spinal cord. 2. Congenitally short pedicles, which narrow the AP diameter of the spinal canal. No significant spinal canal stenosis or neural foraminal narrowing. 3. Small amount of fluid in the proximal trachea, which increases the risk of aspiration. Electronically Signed   By: Wiliam Ke M.D.   On: 06/15/2023 03:43   MR BRAIN W WO CONTRAST  Result Date: 06/15/2023 CLINICAL DATA:  Multiple sclerosis EXAM: MRI HEAD WITHOUT AND WITH CONTRAST TECHNIQUE: Multiplanar, multiecho pulse sequences of the brain and surrounding structures were obtained without and with intravenous contrast. CONTRAST:  6.23mL GADAVIST GADOBUTROL 1 MMOL/ML IV SOLN COMPARISON:  05/17/2023 FINDINGS: Brain: Restricted diffusion with ADC correlate in the medulla (series 5, image 45). Additional restricted diffusion with ADC correlate in the right splenium of the corpus callosum (series 5, image 62) and posterior body (series 5,  image 65). Additional areas of increased signal on diffusion-weighted imaging are correlated with increased signal on ADC, compatible with T2 shine through. Of these, the posterior body lesion is associated with peripheral enhancement (series 19, image 30). The other 2 foci do not demonstrate definite enhancement. All 3 are associated with increased T2 hyperintense signal, with the posterior body and splenium lesions increased in size since the 05/17/2023 exam and the medullary lesion new compared to the prior. Additional contrast enhancement is noted at the anterior aspect of the left frontoparietal lesion that was noted to enhance on the prior exam (series 19, image 32); the current enhancement is less than was  present on the prior exam. No other abnormal enhancement. Several of the T2 hyperintense lesions noted on the prior exam of decreased in size. New T2 hyperintense lesion is noted in the left thalamocapsular region (series 16, image 56). No acute hemorrhage, mass, mass effect, or midline shift. No hydrocephalus or extra-axial collection. Normal pituitary and craniocervical junction. No hemosiderin deposition to suggest remote hemorrhage. Normal cerebral volume for age. Vascular: Normal arterial flow voids. Normal arterial and venous enhancement. Skull and upper cervical spine: Normal marrow signal. Sinuses/Orbits: Minimal mucosal thickening in the ethmoid air cells. Left maxillary mucous retention cyst. Please see same day MRI orbits for orbital findings. Other: The mastoid air cells are well aerated. IMPRESSION: 1. Restricted diffusion in the medulla, right splenium of the corpus callosum, and posterior body of the corpus callosum, with increased T2 hyperintense signal, consistent with active demyelination. The posterior body of the corpus callosum lesion also demonstrates enhancement. 2. Additional T2 hyperintense lesions noted on the prior exam have decreased in size, but there is a new lesion in the left thalamocapsular region that does not enhance or restrict diffusion. 3. Additional contrast enhancement is noted at the anterior aspect of a left frontoparietal lesion that demonstrated more enhancement on the prior exam, which remains concerning for active demyelination. 4. Please see same day MRI orbits for orbital findings. Electronically Signed   By: Wiliam Ke M.D.   On: 06/15/2023 02:56   MR ORBITS W WO CONTRAST  Result Date: 06/15/2023 CLINICAL DATA:  Optic neuritis suspected EXAM: MRI OF THE ORBITS WITHOUT AND WITH CONTRAST TECHNIQUE: Multiplanar, multi-echo pulse sequences of the orbits and surrounding structures were acquired including fat saturation techniques, before and after intravenous  contrast administration. CONTRAST:  6.48mL GADAVIST GADOBUTROL 1 MMOL/ML IV SOLN COMPARISON:  No prior MRI of the orbits, correlation is made with MRI head 05/17/2023 FINDINGS: Orbits: No orbital mass or evidence of inflammation. Normal appearance of the globes, optic nerve-sheath complexes, extraocular muscles, orbital fat and lacrimal glands. No abnormal enhancement. Visualized sinuses: Minimal mucosal thickening in the ethmoid air cells. Mucous retention cyst in the left maxillary sinus. Otherwise clear paranasal sinuses. The mastoids are well aerated. Soft tissues: Normal. Limited intracranial: Please see same day MRI brain. IMPRESSION: Unremarkable MRI of the orbits. No evidence of optic neuritis. Electronically Signed   By: Wiliam Ke M.D.   On: 06/15/2023 02:42    Scheduled Meds:  enoxaparin (LOVENOX) injection  40 mg Subcutaneous Daily   pantoprazole  40 mg Oral Daily   Continuous Infusions:  lactated ringers 75 mL/hr at 06/16/23 0057   methylPREDNISolone (SOLU-MEDROL) injection Stopped (06/15/23 0705)     LOS: 1 day   Time spent:  Azucena Fallen, DO [facility]  If 7PM-7AM, please contact night-coverage [email or web address]  06/16/2023, 7:57 AM

## 2023-06-16 NOTE — Progress Notes (Signed)
Initial Nutrition Assessment  DOCUMENTATION CODES:      INTERVENTION:  Trial Ensure Plus High Protein po daily, each supplement provides 350 kcal and 20 grams of protein.  MVI  Continue regular diet   Continue to replete Vitamin D   Offer patient assistance with po intake and feeding if needed   NUTRITION DIAGNOSIS:   Impaired nutrient utilization related to chronic illness as evidenced by other (comment) (low vitamin D).   GOAL:   Patient will meet greater than or equal to 90% of their needs   MONITOR:   PO intake, Labs, I & O's, Supplement acceptance, Weight trends, Skin  REASON FOR ASSESSMENT:   Consult Assessment of nutrition requirement/status  ASSESSMENT:   22 y.o. male with PMHx including asthma, and recently diagnosed MS who has yet to begin treatment presents with worsening neurologic symptoms  RD unable to reach patient. Chart reviewed   Patient recently treated for MS exacerbation in June and discharged with steroids. He was supposed to begin DMARD therapy but contracted viral gastroenteritis  x last few days which has since resolved    Patient is having vision issues and paraesthesias in his hands which could possibly interfere with po intake   Labs: Na 134, Glu 125, vitamin D 10.5 on 6/15 Meds: solu-medrol, protonix, Vitamin D, lactated ringers    Wt: stable wt 06/14/23 67 kg  06/04/23 67 kg  05/28/23 65.8 kg  05/18/23 66.4 kg  06/18/21 70 kg (53%, Z= 0.08)*  06/17/21 70 kg (53%, Z= 0.08)*    PO: 100% meal intake x 1 documented meal  I/O's: +2.5 L, no UOP documented   NUTRITION - FOCUSED PHYSICAL EXAM:  RD working remotely  Diet Order:   Diet Order             Diet regular Fluid consistency: Thin  Diet effective now                   EDUCATION NEEDS:   No education needs have been identified at this time  Skin:  Skin Assessment: Reviewed RN Assessment  Last BM:  7/10  Height:   Ht Readings from Last 1 Encounters:   06/14/23 5\' 8"  (1.727 m)    Weight:   Wt Readings from Last 1 Encounters:  06/14/23 67 kg    Ideal Body Weight:     BMI:  Body mass index is 22.46 kg/m.  Estimated Nutritional Needs:   Kcal:  4098-1191  Protein:  80-100 g  Fluid:  > 2L    Leodis Rains, RDN, LDN  Clinical Nutrition

## 2023-06-16 NOTE — Evaluation (Signed)
Occupational Therapy Evaluation & discharge  Patient Details Name: Calvin Ward MRN: [id number] DOB: 05/30/2001 Today's Date: 06/16/2023   History of Present Illness Pt is a 22 y.o male who presents to the ED with diarrhea, n/v, tingling in bilat UE, paresthesias on RLE, and blurry vision. Pt has a recent dianosis of MS with symptoms likely ongoing since 2022, treated for MS exacerbation both clinical and radiographic in June. Delayed initiation of Kesimpta (ofatumumab) due to recent GI illness. GI symptoms have subsided with neurological symptoms persisting. Brain imaging 7/13 reveal ongoing demyelination. Significant PMH: Asthma, MS.   Clinical Impression    Alvin was evaluated s/p the above admission list. He reports being indep at baseline, he lives his his mom and does not drive. Upon evaluation, pt demonstrated indep ability to complete mobility and ADLs. He continues to endorse paraesthesias in his hands, R worse and L. Functionally he is [facility] along with ROM and MMT. He also reports blurry vision which is worse in the R eye. Educated pt in low vision strategies including to increase lighting - he verbalized understanding. Pt does not require further acute OT services. Recommend discharge back to pt's environment with assist as needed. OT to sign off with appreciation of order, please re-consult if needed. He would benefit form neuro OP OT as his diagnoses is progressive.       Recommendations for follow up therapy are one component of a multi-disciplinary discharge planning process, led by the attending physician.  Recommendations may be updated based on patient status, additional functional criteria and insurance authorization.   Assistance Recommended at Discharge PRN  Patient can return home with the following Assist for transportation;Help with stairs or ramp for entrance;Direct supervision/assist for medications management;Direct supervision/assist for financial management     Functional Status Assessment  Patient has had a recent decline in their functional status and demonstrates the ability to make significant improvements in function in a reasonable and predictable amount of time.  Equipment Recommendations  None recommended by OT    Recommendations for Other Services       Precautions / Restrictions Precautions Precautions: None Restrictions Weight Bearing Restrictions: No      Mobility Bed Mobility Overal bed mobility: Independent                  Transfers Overall transfer level: Independent                        Balance Overall balance assessment: Mild deficits observed, not formally tested                                         ADL either performed or assessed with clinical judgement   ADL Overall ADL's : Independent                                       General ADL Comments: pt does report some difficulty with buttons intermittently. Denies difficulty with any other ADLs. Reviewed low vision techniques fo rblurry vision. Otherwise pt is indep for ADLs this date     Vision Baseline Vision/History: 0 No visual deficits Patient Visual Report: Blurring of vision Vision Assessment?: Yes Additional Comments: overall WFL desprite blurred vision with R>L. recommend increased lighting and a magnifier  Perception Perception Perception Tested?: No   Praxis Praxis Praxis tested?: Not tested    Pertinent Vitals/Pain Pain Assessment Pain Assessment: No/denies pain     Hand Dominance Right   Extremity/Trunk Assessment Upper Extremity Assessment Upper Extremity Assessment: RUE deficits/detail RUE Deficits / Details: reports paraesthesias in hand, some decreased sensation at shoulder. MMT is 5/5, ROM is WFL.  Coordination is WFL. LUE Deficits / Details: reports paraesthesias in L hand, LUE is slightly weaker than R but overall WFL   Lower Extremity Assessment Lower Extremity  Assessment: Defer to PT evaluation   Cervical / Trunk Assessment Cervical / Trunk Assessment: Normal   Communication Communication Communication: No difficulties   Cognition Arousal/Alertness: Awake/alert Behavior During Therapy: WFL for tasks assessed/performed Overall Cognitive Status: Within Functional Limits for tasks assessed                                 General Comments: limited insight into his dx     General Comments  Overall [facility]    Exercises     Shoulder Instructions      Home Living Family/patient expects to be discharged to:: Private residence Living Arrangements: Parent Available Help at Discharge: Family Type of Home: House Home Access: Level entry     Home Layout: One level     Bathroom Shower/Tub: Producer, television/film/video: Standard     Home Equipment: None   Additional Comments: mom works during the day      Prior Functioning/Environment Prior Level of Function : Independent/Modified Independent             Mobility Comments: no Ad ADLs Comments: indep and has spent most of his time at home since last admission, does not drive, able to recall is medication by name`        OT Problem List: Impaired sensation;Impaired vision/perception      OT Treatment/Interventions:      OT Goals(Current goals can be found in the care plan section) Acute Rehab OT Goals Patient Stated Goal: home OT Goal Formulation: With patient Time For Goal Achievement: 06/16/23 Potential to Achieve Goals: Good  OT Frequency:      Co-evaluation              AM-PAC OT "6 Clicks" Daily Activity     Outcome Measure Help from another person eating meals?: None Help from another person taking care of personal grooming?: None Help from another person toileting, which includes using toliet, bedpan, or urinal?: None Help from another person bathing (including washing, rinsing, drying)?: None Help from another person to put on and  taking off regular upper body clothing?: None Help from another person to put on and taking off regular lower body clothing?: None 6 Click Score: 24   End of Session Nurse Communication: Mobility status  Activity Tolerance: Patient tolerated treatment well Patient left: in bed  OT Visit Diagnosis: Other (comment) (MS blurry vision)                Time: 0960-4540 OT Time Calculation (min): 13 min Charges:  OT General Charges $OT Visit: 1 Visit OT Evaluation $OT Eval Low Complexity: 1 Low  Derenda Mis, OTR/L Acute Rehabilitation Services Office [phone] Secure Chat Communication Preferred   Donia Pounds 06/16/2023, 1:49 PM

## 2023-06-16 NOTE — Evaluation (Signed)
Physical Therapy Evaluation Patient Details Name: Calvin Ward MRN: [id number] DOB: Feb 02, 2001 Today's Date: 06/16/2023  History of Present Illness  Pt is a 22 y.o male who presents to the ED with diarrhea, n/v, tingling in bilat UE, paresthesias on RLE, and blurry vision. Pt has a recent dianosis of MS with symptoms likely ongoing since 2022, treated for MS exacerbation both clinical and radiographic in June. Delayed initiation of Kesimpta (ofatumumab) due to recent GI illness. GI symptoms have subsided with neurological symptoms persisting. Brain imaging 7/13 reveal ongoing demyelination. Significant PMH: Asthma, MS.   Clinical Impression  PTA pt reports independence with functional mobility and ADL's, lives at home with his mother. Scheduled to begin OP PT (neuro) 7/17 following most recent [facility] admission. Pt is independent with mobility today, amb 250' with no AD. Observed decreased step length and weight shifting to R side. Pt with diminished light touch sensitivity R>L side, with specific deficits listed below. Pt reports intermittent blurry vision, increased with L eye occluded. At this time, pt no longer requires skilled PT, would benefit from frequent ambulation throughout the day to promote activity. PT to continue with rec for OP PT (neuro) to facilitate improvements in the listed deficits and promote independence with functional activity      Assistance Recommended at Discharge PRN  If plan is discharge home, recommend the following:  Can travel by private vehicle  Help with stairs or ramp for entrance;Assist for transportation        Equipment Recommendations None recommended by PT  Recommendations for Other Services       Functional Status Assessment Patient has not had a recent decline in their functional status     Precautions / Restrictions Precautions Precautions: None Restrictions Weight Bearing Restrictions: No      Mobility  Bed Mobility Overal bed  mobility: Independent                  Transfers Overall transfer level: Independent Equipment used: None               General transfer comment: Adequate power to push up, steadying upon stand.    Ambulation/Gait Ambulation/Gait assistance: Independent Gait Distance (Feet): 250 Feet Assistive device: None Gait Pattern/deviations: Step-through pattern, Decreased step length - right, Decreased weight shift to right   Gait velocity interpretation: >4.37 ft/sec, indicative of normal walking speed   General Gait Details: Shortened R step length, reports feeling slightly wobbly, demos bilateral turns without LOB, able to identify locations at distance in the hallway.  Stairs            Wheelchair Mobility     Tilt Bed    Modified Rankin (Stroke Patients Only)       Balance Overall balance assessment: Mild deficits observed, not formally tested                                           Pertinent Vitals/Pain Pain Assessment Pain Assessment: No/denies pain    Home Living Family/patient expects to be discharged to:: Private residence Living Arrangements: Parent Available Help at Discharge: Family Type of Home: House Home Access: Level entry       Home Layout: One level Home Equipment: None Additional Comments: mom works during the day    Prior Function Prior Level of Function : Independent/Modified Independent  ADLs Comments: reports being at home mostly since last admission     Hand Dominance   Dominant Hand: Right    Extremity/Trunk Assessment   Upper Extremity Assessment Upper Extremity Assessment: RUE deficits/detail;LUE deficits/detail RUE Deficits / Details: Diminished to light touch medial R shoulder RUE Sensation: decreased light touch LUE Deficits / Details: Decreased to light touch with medial L elbow LUE Sensation: decreased light touch    Lower Extremity Assessment Lower Extremity  Assessment: RLE deficits/detail;LLE deficits/detail RLE Deficits / Details: Diminished light touch medial/lateral R ankle RLE Sensation: decreased light touch LLE Deficits / Details: Decreased to light touch medial/lateral ankle but not as diminished as R side LLE Sensation: decreased light touch    Cervical / Trunk Assessment Cervical / Trunk Assessment: Normal  Communication   Communication: No difficulties  Cognition Arousal/Alertness: Awake/alert Behavior During Therapy: WFL for tasks assessed/performed Overall Cognitive Status: Within Functional Limits for tasks assessed                                          General Comments General comments (skin integrity, edema, etc.): Reports R eye > L eye blurry. Assessed peripheral visual field with no significant observable impairment. Increased blur with L eye occluded.    Exercises     Assessment/Plan    PT Assessment All further PT needs can be met in the next venue of care  PT Problem List Impaired sensation;Decreased mobility       PT Treatment Interventions      PT Goals (Current goals can be found in the Care Plan section)  Acute Rehab PT Goals Patient Stated Goal: feel normal PT Goal Formulation: All assessment and education complete, DC therapy    Frequency       Co-evaluation               AM-PAC PT "6 Clicks" Mobility  Outcome Measure Help needed turning from your back to your side while in a flat bed without using bedrails?: None Help needed moving from lying on your back to sitting on the side of a flat bed without using bedrails?: None Help needed moving to and from a bed to a chair (including a wheelchair)?: None Help needed standing up from a chair using your arms (e.g., wheelchair or bedside chair)?: None Help needed to walk in hospital room?: None Help needed climbing 3-5 steps with a railing? : A Little 6 Click Score: 23    End of Session Equipment Utilized During  Treatment: Gait belt Activity Tolerance: Patient tolerated treatment well Patient left: in bed;with call bell/phone within reach Nurse Communication: Mobility status PT Visit Diagnosis: Unsteadiness on feet (R26.81);Other symptoms and signs involving the nervous system (R29.898);Difficulty in walking, not elsewhere classified (R26.2)    Time: 1610-9604 PT Time Calculation (min) (ACUTE ONLY): 14 min   Charges:   PT Evaluation $PT Eval Low Complexity: 1 Low   PT General Charges $$ ACUTE PT VISIT: 1 Visit         Hendricks Milo, SPT  Acute Rehabilitation Services   Hendricks Milo 06/16/2023, 10:52 AM

## 2023-06-16 NOTE — Plan of Care (Signed)
Patient alert/oriented X4. Patient compliant with medication administration and tolerated IV steroids/cont. Fluids. Patient ambulated around room/hall unassisted and was up in chair majority of shift. VSS, no complaints at this time.  Problem: Education: Goal: Knowledge of General Education information will improve Description: Including pain rating scale, medication(s)/side effects and non-pharmacologic comfort measures Outcome: Progressing   Problem: Health Behavior/Discharge Planning: Goal: Ability to manage health-related needs will improve Outcome: Progressing   Problem: Clinical Measurements: Goal: Ability to maintain clinical measurements within normal limits will improve Outcome: Progressing   Problem: Clinical Measurements: Goal: Diagnostic test results will improve Outcome: Progressing   Problem: Clinical Measurements: Goal: Respiratory complications will improve Outcome: Progressing   Problem: Clinical Measurements: Goal: Cardiovascular complication will be avoided Outcome: Progressing   Problem: Activity: Goal: Risk for activity intolerance will decrease Outcome: Progressing   Problem: Nutrition: Goal: Adequate nutrition will be maintained Outcome: Progressing   Problem: Coping: Goal: Level of anxiety will decrease Outcome: Progressing   Problem: Elimination: Goal: Will not experience complications related to bowel motility Outcome: Progressing   Problem: Elimination: Goal: Will not experience complications related to urinary retention Outcome: Progressing   Problem: Pain Managment: Goal: General experience of comfort will improve Outcome: Progressing   Problem: Safety: Goal: Ability to remain free from injury will improve Outcome: Progressing   Problem: Skin Integrity: Goal: Risk for impaired skin integrity will decrease Outcome: Progressing

## 2023-06-17 ENCOUNTER — Other Ambulatory Visit: Payer: Self-pay

## 2023-06-17 ENCOUNTER — Encounter: Payer: Self-pay | Admitting: Neurology

## 2023-06-17 ENCOUNTER — Other Ambulatory Visit: Payer: Self-pay | Admitting: *Deleted

## 2023-06-17 DIAGNOSIS — G35 Multiple sclerosis: Secondary | ICD-10-CM | POA: Diagnosis not present

## 2023-06-17 MED ORDER — SODIUM CHLORIDE 0.9 % IV SOLN: 1000.0000 mg | Freq: Every day | INTRAVENOUS | Status: AC

## 2023-06-17 NOTE — Discharge Summary (Signed)
Physician Discharge Summary   Patient: Calvin Ward MRN: [id number] DOB: [birth year]  Admit date:     06/14/2023  Discharge date: 06/17/23  Discharge Physician: Tyrone Nine   PCP: Pcp, No   Recommendations at discharge:  Follow up with neurology, Dr. Epimenio Foot shortly following discharge for ongoing MS management. Treated with IV solumedrol for flare, will complete last 2 days of 5 as outpatient (7/16 [scheduled 2:45pm]-7/17).   Discharge Diagnoses: Principal Problem:   Exacerbation of multiple sclerosis [facility])  Hospital Course: Calvin Ward is a 22 y.o. male with medical history significant of asthma and recently diagnosed MS who has not yet started treatment presenting with worsening neurologic symptoms including blurry vision left greater than right with diffuse extremity paresthesias.  Hospitalist called for admission, neurology called in consult. Symptoms resolved/returned to baseline after 3 doses of IV solumedrol. Neurology has reevaluated the patient, recommending discharge home with plans to complete 5 days as an outpatient at the infusion center. He had nausea, vomiting, and diarrhea prior to arrival which have resolved. He will follow up with his neurologist, Dr. Epimenio Foot, after discharge with plans to start kesimpta.  Consultants: Neurology Procedures performed: None  Disposition: Home Diet recommendation:  Regular diet DISCHARGE MEDICATION: Allergies as of 06/17/2023       Reactions   Shellfish Allergy         Medication List     TAKE these medications    Kesimpta 20 MG/0.4ML Soaj Generic drug: Ofatumumab Inject into the skin.   methylPREDNISolone sodium succinate 1,000 mg in sodium chloride 0.9 % 50 mL Inject 1,000 mg into the vein daily for 2 days. Start taking on: June 18, 2023        Follow-up Information     [facility] Follow up.   Specialty: Rehabilitation Why: Call to schedule apt for physical and occupation  therapy Contact information: [address] [id number] mc [place] [address] [phone]        Infusion Center Follow up in 1 day(s).   Contact information: [address] [place], [place] [phone]        Epimenio Foot, Pearletha Furl, MD Follow up.   Specialty: Neurology Contact information: [address] [place] [address] [phone]                Discharge Exam: Calvin Ward Weights   06/14/23 1506  Weight: 67 kg  BP 118/66 (BP Location: Right Arm)   Pulse 85   Temp 98.5 F (36.9 C)   Resp 14   Ht 5\' 8"  (1.727 m)   Wt 67 kg   SpO2 100%   BMI 22.46 kg/m   No distress, well-appearing. Spoke with mother by speaker phone at bedside.  Mild upper > lower extremity paresthesias but no focal weakness and pt reports he has returned to his baseline. No new deficits noted.   Condition at discharge: stable  The results of significant diagnostics from this hospitalization (including imaging, microbiology, ancillary and laboratory) are listed below for reference.   Imaging Studies: DG Chest 1 View  Result Date: 06/15/2023 CLINICAL DATA:  22 year old male with history of cough. EXAM: CHEST  1 VIEW COMPARISON:  No priors. FINDINGS: Lung volumes are normal. No consolidative airspace disease. No pleural effusions. No pneumothorax. No pulmonary nodule or mass noted. Pulmonary vasculature and the cardiomediastinal silhouette are within normal limits. IMPRESSION: No radiographic evidence of acute cardiopulmonary disease. Electronically Signed   By: Trudie Reed M.D.   On: 06/15/2023 05:26  MR Cervical Spine W and Wo Contrast  Result Date: 06/15/2023 CLINICAL DATA:  Multiple sclerosis EXAM: MRI CERVICAL SPINE WITHOUT AND WITH CONTRAST TECHNIQUE: Multiplanar and multiecho pulse sequences of the cervical spine, to include the craniocervical junction and cervicothoracic junction, were obtained without and with intravenous contrast. CONTRAST:  6.12mL  GADAVIST GADOBUTROL 1 MMOL/ML IV SOLN COMPARISON:  05/17/2023 FINDINGS: Alignment: Straightening of the normal cervical lordosis. No listhesis. Vertebrae: No acute fracture, evidence of discitis, or suspicious osseous lesion. Congenitally short pedicles, which narrow the AP diameter of the spinal canal. Cord: Normal signal and morphology. No abnormal enhancement or T2 hyperintense lesions. Posterior Fossa, vertebral arteries, paraspinal tissues: Small amount of fluid in the proximal trachea (series 7, image 31), which increases the risk of aspiration. Otherwise negative. Disc levels: No significant spinal canal stenosis or neural foraminal narrowing. IMPRESSION: 1. No evidence of demyelinating disease in the cervical spinal cord. 2. Congenitally short pedicles, which narrow the AP diameter of the spinal canal. No significant spinal canal stenosis or neural foraminal narrowing. 3. Small amount of fluid in the proximal trachea, which increases the risk of aspiration. Electronically Signed   By: Wiliam Ke M.D.   On: 06/15/2023 03:43   MR BRAIN W WO CONTRAST  Result Date: 06/15/2023 CLINICAL DATA:  Multiple sclerosis EXAM: MRI HEAD WITHOUT AND WITH CONTRAST TECHNIQUE: Multiplanar, multiecho pulse sequences of the brain and surrounding structures were obtained without and with intravenous contrast. CONTRAST:  6.72mL GADAVIST GADOBUTROL 1 MMOL/ML IV SOLN COMPARISON:  05/17/2023 FINDINGS: Brain: Restricted diffusion with ADC correlate in the medulla (series 5, image 45). Additional restricted diffusion with ADC correlate in the right splenium of the corpus callosum (series 5, image 62) and posterior body (series 5, image 65). Additional areas of increased signal on diffusion-weighted imaging are correlated with increased signal on ADC, compatible with T2 shine through. Of these, the posterior body lesion is associated with peripheral enhancement (series 19, image 30). The other 2 foci do not demonstrate definite  enhancement. All 3 are associated with increased T2 hyperintense signal, with the posterior body and splenium lesions increased in size since the 05/17/2023 exam and the medullary lesion new compared to the prior. Additional contrast enhancement is noted at the anterior aspect of the left frontoparietal lesion that was noted to enhance on the prior exam (series 19, image 32); the current enhancement is less than was present on the prior exam. No other abnormal enhancement. Several of the T2 hyperintense lesions noted on the prior exam of decreased in size. New T2 hyperintense lesion is noted in the left thalamocapsular region (series 16, image 56). No acute hemorrhage, mass, mass effect, or midline shift. No hydrocephalus or extra-axial collection. Normal pituitary and craniocervical junction. No hemosiderin deposition to suggest remote hemorrhage. Normal cerebral volume for age. Vascular: Normal arterial flow voids. Normal arterial and venous enhancement. Skull and upper cervical spine: Normal marrow signal. Sinuses/Orbits: Minimal mucosal thickening in the ethmoid air cells. Left maxillary mucous retention cyst. Please see same day MRI orbits for orbital findings. Other: The mastoid air cells are well aerated. IMPRESSION: 1. Restricted diffusion in the medulla, right splenium of the corpus callosum, and posterior body of the corpus callosum, with increased T2 hyperintense signal, consistent with active demyelination. The posterior body of the corpus callosum lesion also demonstrates enhancement. 2. Additional T2 hyperintense lesions noted on the prior exam have decreased in size, but there is a new lesion in the left thalamocapsular region that does not enhance or restrict  diffusion. 3. Additional contrast enhancement is noted at the anterior aspect of a left frontoparietal lesion that demonstrated more enhancement on the prior exam, which remains concerning for active demyelination. 4. Please see same day MRI  orbits for orbital findings. Electronically Signed   By: Wiliam Ke M.D.   On: 06/15/2023 02:56   MR ORBITS W WO CONTRAST  Result Date: 06/15/2023 CLINICAL DATA:  Optic neuritis suspected EXAM: MRI OF THE ORBITS WITHOUT AND WITH CONTRAST TECHNIQUE: Multiplanar, multi-echo pulse sequences of the orbits and surrounding structures were acquired including fat saturation techniques, before and after intravenous contrast administration. CONTRAST:  6.31mL GADAVIST GADOBUTROL 1 MMOL/ML IV SOLN COMPARISON:  No prior MRI of the orbits, correlation is made with MRI head 05/17/2023 FINDINGS: Orbits: No orbital mass or evidence of inflammation. Normal appearance of the globes, optic nerve-sheath complexes, extraocular muscles, orbital fat and lacrimal glands. No abnormal enhancement. Visualized sinuses: Minimal mucosal thickening in the ethmoid air cells. Mucous retention cyst in the left maxillary sinus. Otherwise clear paranasal sinuses. The mastoids are well aerated. Soft tissues: Normal. Limited intracranial: Please see same day MRI brain. IMPRESSION: Unremarkable MRI of the orbits. No evidence of optic neuritis. Electronically Signed   By: Wiliam Ke M.D.   On: 06/15/2023 02:42    Microbiology: No results found for this or any previous visit.  Labs: CBC: Recent Labs  Lab 06/12/23 1732 06/14/23 1851 06/16/23 0210  WBC 15.6* 5.7 10.7*  HGB 14.9 16.6 13.7  HCT 42.1 47.5 37.2*  MCV 87.2 89.0 84.4  PLT 408* 434* 439*   Basic Metabolic Panel: Recent Labs  Lab 06/12/23 1732 06/14/23 1851 06/16/23 0210  NA 138 138 134*  K 4.3 3.5 3.6  CL 102 103 102  CO2 25 25 24   GLUCOSE 146* 91 125*  BUN 11 12 9   CREATININE 0.88 1.06 0.88  CALCIUM 10.0 9.7 9.2  MG  --  2.2  --    Liver Function Tests: Recent Labs  Lab 06/12/23 1732 06/14/23 1851  AST 17 19  ALT 27 25  ALKPHOS 60 67  BILITOT 0.8 1.5*  PROT 7.6 7.9  ALBUMIN 4.9 4.6   CBG: Recent Labs  Lab 06/16/23 0702  GLUCAP 93     Discharge time spent: greater than 30 minutes.  Signed: Tyrone Nine, MD [facility] 06/17/2023

## 2023-06-17 NOTE — Progress Notes (Signed)
NEUROLOGY CONSULTATION PROGRESS NOTE   Date of service: June 17, 2023 Patient Name: Calvin Ward MRN:  [id number] DOB:  20-May-2001  Brief HPI  Calvin Ward is a 22 y.o. male with PMH significant for MS, recently started on Kesimpta but has not taken it yet. He presented with 2-3 day hx of blurred vision and dyseuilibrium. He is on IV solumedrol and today is day 3 out of 5.   Interval Hx   Significantly improved and pretty much abck to his baseline. Wants to leave for home.  Discussed taking rest of his Steroids for 2 additional days at home. He seems to be okay with this plan but his mother is not agreeable to this. Her concern is that this is too many pills and that he had dizziness and felt like overdose when he did this last time. She requests that rest of his IV solumedrol be done as an infusion outpatient.   Vitals   Vitals:   06/16/23 1555 06/16/23 1951 06/17/23 0416 06/17/23 0756  BP: 137/63 128/69 (!) 104/36 118/66  Pulse: 88 100 (!) 56 85  Resp: 16 17 17 14   Temp: 98.3 F (36.8 C) 99 F (37.2 C) 98.5 F (36.9 C)   TempSrc:  Oral    SpO2: 99% 100% 97% 100%  Weight:      Height:         Body mass index is 22.46 kg/m.  Physical Exam   General: Laying comfortably in bed; in no acute distress.  HENT: Normal oropharynx and mucosa. Normal external appearance of ears and nose.  Neck: Supple, no pain or tenderness  CV: No JVD. No peripheral edema.  Pulmonary: Symmetric Chest rise. Normal respiratory effort.  Abdomen: Soft to touch, non-tender.  Ext: No cyanosis, edema, or deformity  Skin: No rash. Normal palpation of skin.   Musculoskeletal: Normal digits and nails by inspection. No clubbing.   Neurologic Examination  Mental status/Cognition: Alert, oriented to self, place, month and year, good attention.  Speech/language: Fluent, comprehension intact, object naming intact, repetition intact.  Cranial nerves:   CN II Pupils equal and reactive to light, no VF  deficits    CN III,IV,VI EOM intact, no gaze preference or deviation, no nystagmus    CN V normal sensation in V1, V2, and V3 segments bilaterally    CN VII no asymmetry, no nasolabial fold flattening    CN VIII normal hearing to speech    CN IX & X normal palatal elevation, no uvular deviation    CN XI 5/5 head turn and 5/5 shoulder shrug bilaterally    CN XII midline tongue protrusion    Motor:  Muscle bulk: normal, tone normal, pronator drift none tremor none Mvmt Root Nerve  Muscle Right Left Comments  SA C5/6 Ax Deltoid 5 5   EF C5/6 Mc Biceps 5 5   EE C6/7/8 Rad Triceps 5 5   WF C6/7 Med FCR     WE C7/8 PIN ECU     F Ab C8/T1 U ADM/FDI 5 5   HF L1/2/3 Fem Illopsoas 5 5   KE L2/3/4 Fem Quad 5 5   DF L4/5 D Peron Tib Ant 5 5   PF S1/2 Tibial Grc/Sol 5 5    Sensation:  Light touch Intact throughout   Pin prick    Temperature    Vibration   Proprioception    Coordination/Complex Motor:  - Finger to Nose intact BL - Heel to shin intact BL - Rapid  alternating movement are normal - Gait: Stride length normal. Arm swing normal. Base width narrow.  Labs   Basic Metabolic Panel:  Lab Results  Component Value Date   NA 134 (L) 06/16/2023   K 3.6 06/16/2023   CO2 24 06/16/2023   GLUCOSE 125 (H) 06/16/2023   BUN 9 06/16/2023   CREATININE 0.88 06/16/2023   CALCIUM 9.2 06/16/2023   GFRNONAA >60 06/16/2023   HbA1c: No results found for: "HGBA1C" LDL: No results found for: "LDLCALC" Urine Drug Screen: No results found for: "LABOPIA", "COCAINSCRNUR", "LABBENZ", "AMPHETMU", "THCU", "LABBARB"  Alcohol Level No results found for: "ETH" No results found for: "PHENYTOIN", "ZONISAMIDE", "LAMOTRIGINE", "LEVETIRACETA" No results found for: "PHENYTOIN", "PHENOBARB", "VALPROATE", "CBMZ"  Imaging and Diagnostic studies    MRI examination of the brain with and without contrast: Restricted diffusion in the medulla, right splenium of the corpus callosum and posterior body of the  corpus callosum with increased T2 STIR hyperintense signal consistent with active demyelination.  Posterior body of the corpus callosum lesion also demonstrates enhancement.  There are additional T2 lesions noted in the prior exam that have decreased in size but there is new lesion on the left thalamic capsular region that does not enhance or restrict.  Additional contrast-enhancement noted in the anterior aspect of the left frontoparietal lesion that demonstrated more enhancement on the prior exam but still remains concerning for active demyelination.   MR orbits with and without contrast: No evidence of optic neuritis   MRI C-spine with and without contrast: No evidence of demyelinating disease in the cervical spinal cord.  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  No significant spinal calcinosis or foraminal narrowing.  Small amount of fluid in the proximal trachea  Impression   Calvin Ward is a 22 y.o. male with PMH significant for MS, recently started on Kesimpta but has not taken it yet. He presented with 2-3 day hx of blurred vision and dyseuilibrium. He is on IV solumedrol and today is day 3 out of 5.  Recommendations  - rest of 2 doses of IV solumedrol outpatient at the infusion center tomorrow and 7/17. - he will reach out to Dr. Bonnita Hollow office today regarding timing of starting Kesimpta. - Follow up with Dr. Epimenio Foot with [facility] in the next couple of weeks - we will signoff. Please feel free to contact us with any questions or concerns. ______________________________________________________________________   Thank you for the opportunity to take part in the care of this patient. If you have any further questions, please contact the neurology consultation attending.  Signed,  Erick Blinks [facility]

## 2023-06-17 NOTE — TOC Initial Note (Signed)
Transition of Care [facility]) - Initial/Assessment Note    Patient Details  Name: Calvin Ward MRN: [id number] Date of Birth: October 24, 2001  Transition of Care [facility]) CM/SW Contact:    Harriet Masson, RN Phone Number: 06/17/2023, 11:46 AM  Clinical Narrative:                 Spoke to patient regarding transition needs. Patient states he follows up with Dr. Epimenio Foot, neurologist. Paitent states he has PCP but doesn't know the PCP name.  Patient states he already has referral to OP PT, OT at [address]. Today is Day 3 out of 5 days of iv steroid. TOC following. Expected Discharge Plan: OP Rehab Barriers to Discharge: Continued Medical Work up   Patient Goals and CMS Choice Patient states their goals for this hospitalization and ongoing recovery are:: return home          Expected Discharge Plan and Services       Living arrangements for the past 2 months: Single Family Home                                      Prior Living Arrangements/Services Living arrangements for the past 2 months: Single Family Home   Patient language and need for interpreter reviewed:: Yes Do you feel safe going back to the place where you live?: Yes      Need for Family Participation in Patient Care: Yes (Comment) Care giver support system in place?: Yes (comment)   Criminal Activity/Legal Involvement Pertinent to Current Situation/Hospitalization: No - Comment as needed  Activities of Daily Living Home Assistive Devices/Equipment: None ADL Screening (condition at time of admission) Patient's cognitive ability adequate to safely complete daily activities?: Yes Is the patient deaf or have difficulty hearing?: No Does the patient have difficulty seeing, even when wearing glasses/contacts?: No Does the patient have difficulty concentrating, remembering, or making decisions?: No Patient able to express need for assistance with ADLs?: Yes Does the patient have difficulty dressing or  bathing?: No Independently performs ADLs?: Yes (appropriate for developmental age) Does the patient have difficulty walking or climbing stairs?: No Weakness of Legs: Both Weakness of Arms/Hands: None  Permission Sought/Granted                  Emotional Assessment   Attitude/Demeanor/Rapport: Product/process development scientist, Engaged   Orientation: : Oriented to Self, Oriented to Place, Oriented to  Time, Oriented to Situation Alcohol / Substance Use: Not Applicable Psych Involvement: No (comment)  Admission diagnosis:  Exacerbation of multiple sclerosis (HCC) [G35] Multiple sclerosis exacerbation (HCC) [G35] Patient Active Problem List   Diagnosis Date Noted   Exacerbation of multiple sclerosis (HCC) 06/15/2023   Vitamin D deficiency 05/18/2023   Multiple sclerosis (HCC) 05/17/2023   Generalized abdominal pain 05/04/2019   PCP:  Pcp, No Pharmacy:   [facility] [phone] - [place], [place] - [address] AT [facility] [address] [place] [address] Phone: [phone] Fax: [phone]  [facility] [address] [place] [address] Phone: [phone] Fax: [phone]     Social Determinants of Health (SDOH) Social History: SDOH Screenings   Food Insecurity: No Food Insecurity (06/15/2023)  Housing: Patient Declined (06/15/2023)  Transportation Needs: No Transportation Needs (06/15/2023)  Utilities: Not At Risk (06/15/2023)  Depression (PHQ2-9): High Risk (06/04/2023)  Tobacco Use: Low Risk  (  06/14/2023)   SDOH Interventions:     Readmission Risk Interventions     No data to display

## 2023-06-17 NOTE — Plan of Care (Signed)
Patient alert/oriented X4. Patient compliant with medication administration and tolerated IV steroids. Patient complained of no pain. Patient PIV removed prior to discharge and AVS discharge instructions explained in detail. VSS.  Problem: Education: Goal: Knowledge of General Education information will improve Description: Including pain rating scale, medication(s)/side effects and non-pharmacologic comfort measures Outcome: Adequate for Discharge   Problem: Health Behavior/Discharge Planning: Goal: Ability to manage health-related needs will improve Outcome: Adequate for Discharge   Problem: Clinical Measurements: Goal: Ability to maintain clinical measurements within normal limits will improve Outcome: Adequate for Discharge   Problem: Clinical Measurements: Goal: Will remain free from infection Outcome: Adequate for Discharge   Problem: Clinical Measurements: Goal: Diagnostic test results will improve Outcome: Adequate for Discharge   Problem: Clinical Measurements: Goal: Respiratory complications will improve Outcome: Adequate for Discharge   Problem: Clinical Measurements: Goal: Cardiovascular complication will be avoided Outcome: Adequate for Discharge   Problem: Activity: Goal: Risk for activity intolerance will decrease Outcome: Adequate for Discharge   Problem: Nutrition: Goal: Adequate nutrition will be maintained Outcome: Adequate for Discharge   Problem: Coping: Goal: Level of anxiety will decrease Outcome: Adequate for Discharge   Problem: Elimination: Goal: Will not experience complications related to bowel motility Outcome: Adequate for Discharge   Problem: Elimination: Goal: Will not experience complications related to urinary retention Outcome: Adequate for Discharge   Problem: Pain Managment: Goal: General experience of comfort will improve Outcome: Adequate for Discharge   Problem: Safety: Goal: Ability to remain free from injury will  improve Outcome: Adequate for Discharge   Problem: Skin Integrity: Goal: Risk for impaired skin integrity will decrease Outcome: Adequate for Discharge

## 2023-06-17 NOTE — Progress Notes (Signed)
Error

## 2023-06-18 ENCOUNTER — Ambulatory Visit (INDEPENDENT_AMBULATORY_CARE_PROVIDER_SITE_OTHER): Payer: Medicaid Other

## 2023-06-18 ENCOUNTER — Encounter: Payer: Self-pay | Admitting: Pulmonary Disease

## 2023-06-18 ENCOUNTER — Other Ambulatory Visit (HOSPITAL_COMMUNITY): Payer: Self-pay

## 2023-06-18 VITALS — BP 134/74 | HR 75 | Temp 98.3°F | Resp 18 | Ht 68.0 in | Wt 141.4 lb

## 2023-06-18 DIAGNOSIS — G35 Multiple sclerosis: Secondary | ICD-10-CM | POA: Diagnosis not present

## 2023-06-18 MED ORDER — SODIUM CHLORIDE 0.9 % IV SOLN
1000.0000 mg | Freq: Once | INTRAVENOUS | Status: AC
  Administered 2023-06-18: 1000 mg via INTRAVENOUS
  Filled 2023-06-18: qty 16

## 2023-06-18 NOTE — Progress Notes (Signed)
Diagnosis: Multiple Sclerosis  Provider:  Chilton Greathouse MD  Procedure: IV Infusion  IV Type: Peripheral, IV Location: R Antecubital  Solumedrol (Methylprednisolone), Dose: 1000 mg  Infusion Start Time: 1459  Infusion Stop Time: 1551  Post Infusion IV Care: Peripheral IV Discontinued  Discharge: Condition: Good, Destination: Home . AVS Declined  Performed by:  Adriana Mccallum, RN

## 2023-06-19 ENCOUNTER — Ambulatory Visit: Payer: Medicaid Other | Admitting: Physical Therapy

## 2023-06-19 ENCOUNTER — Ambulatory Visit (INDEPENDENT_AMBULATORY_CARE_PROVIDER_SITE_OTHER): Payer: Medicaid Other

## 2023-06-19 ENCOUNTER — Telehealth: Payer: Self-pay | Admitting: *Deleted

## 2023-06-19 ENCOUNTER — Ambulatory Visit: Payer: Medicaid Other | Admitting: Occupational Therapy

## 2023-06-19 VITALS — BP 130/78 | HR 85 | Temp 97.4°F | Resp 14 | Ht 68.0 in | Wt 140.4 lb

## 2023-06-19 DIAGNOSIS — G35 Multiple sclerosis: Secondary | ICD-10-CM | POA: Diagnosis not present

## 2023-06-19 MED ORDER — SODIUM CHLORIDE 0.9 % IV SOLN
1000.0000 mg | Freq: Once | INTRAVENOUS | Status: AC
  Administered 2023-06-19: 1000 mg via INTRAVENOUS
  Filled 2023-06-19: qty 16

## 2023-06-19 NOTE — Progress Notes (Signed)
Diagnosis: Multiple Sclerosis  Provider:  Chilton Greathouse MD  Procedure: IV Infusion  IV Type: Peripheral, IV Location: R Antecubital  Solumedrol (Methylprednisolone), Dose: 1000 mg  Infusion Start Time: 1524  Infusion Stop Time: 1624  Post Infusion IV Care: Peripheral IV Discontinued  Discharge: Condition: Good, Destination: Home . AVS Declined  Performed by:  Loney Hering, LPN

## 2023-06-19 NOTE — Telephone Encounter (Signed)
Pt came in today for his first Kesimpta injection.  His mother was with him.. wanted to make sure he did not have reaction.  Pt and mother had been given instruction by [employer] nurse.  I did explain process/ procedure.   Explained to get all supplies he would need for injection.  Wash hands. Kesimpta first dose 20mg  / 00.46ml week one.  He gave himself the injection in R outer thigh.   He did well. Tolerated well.  Bandaid applied.  He noted slight itiching.  He know will receive again in 1 week.  He was going to his last day of steroids today.  He was tearful, just feeling like his usual self.  I told him that steroids have a lot of side effects, insomnia one (he stated he did sleep well last night.  I relayed if any questions to let me know.  They both were appreciative.

## 2023-06-24 ENCOUNTER — Encounter: Payer: Self-pay | Admitting: Neurology

## 2023-06-24 ENCOUNTER — Other Ambulatory Visit: Payer: Self-pay

## 2023-06-24 ENCOUNTER — Other Ambulatory Visit: Payer: Self-pay | Admitting: Neurology

## 2023-06-24 DIAGNOSIS — R269 Unspecified abnormalities of gait and mobility: Secondary | ICD-10-CM

## 2023-06-24 DIAGNOSIS — G35 Multiple sclerosis: Secondary | ICD-10-CM

## 2023-06-24 MED ORDER — TRAZODONE HCL 50 MG PO TABS: 50.0000 mg | ORAL_TABLET | Freq: Every day | ORAL | 3 refills | Status: AC

## 2023-07-02 ENCOUNTER — Other Ambulatory Visit: Payer: Self-pay

## 2023-07-02 ENCOUNTER — Ambulatory Visit: Payer: Medicaid Other | Attending: Family Medicine | Admitting: Occupational Therapy

## 2023-07-02 ENCOUNTER — Encounter: Payer: Self-pay | Admitting: Occupational Therapy

## 2023-07-02 DIAGNOSIS — M6281 Muscle weakness (generalized): Secondary | ICD-10-CM | POA: Diagnosis present

## 2023-07-02 DIAGNOSIS — R208 Other disturbances of skin sensation: Secondary | ICD-10-CM | POA: Diagnosis present

## 2023-07-02 DIAGNOSIS — R29818 Other symptoms and signs involving the nervous system: Secondary | ICD-10-CM | POA: Diagnosis present

## 2023-07-02 DIAGNOSIS — G35 Multiple sclerosis: Secondary | ICD-10-CM | POA: Diagnosis present

## 2023-07-02 DIAGNOSIS — R278 Other lack of coordination: Secondary | ICD-10-CM | POA: Diagnosis present

## 2023-07-02 NOTE — Therapy (Signed)
OUTPATIENT OCCUPATIONAL THERAPY NEURO EVALUATION  Patient Name: Calvin Ward MRN: [id number] DOB:2001-02-05, 22 y.o., male Today's Date: 07/02/2023  PCP: Alberteen Sam, MD REFERRING PROVIDER: Alberteen Sam, MD  END OF SESSION:  OT End of Session - 07/02/23 0935     Visit Number 1    Number of Visits 12   + evaluation   Date for OT Re-Evaluation 08/30/23    Authorization Type [place] MEDICAID WELLCARE    OT Start Time 0934    OT Stop Time 1019    OT Time Calculation (min) 45 min    Equipment Utilized During Treatment testing material    Activity Tolerance Patient tolerated treatment well    Behavior During Therapy WFL for tasks assessed/performed             Past Medical History:  Diagnosis Date   Asthma    MS (multiple sclerosis) (HCC)    History reviewed. No pertinent surgical history. Patient Active Problem List   Diagnosis Date Noted   Exacerbation of multiple sclerosis (HCC) 06/15/2023   Vitamin D deficiency 05/18/2023   Multiple sclerosis (HCC) 05/17/2023   Generalized abdominal pain 05/04/2019    ONSET DATE: 05/20/2023  REFERRING DIAG: G35 (ICD-10-CM) - Multiple sclerosis (HCC)  THERAPY DIAG:  Other symptoms and signs involving the nervous system  Other disturbances of skin sensation  Muscle weakness (generalized)  Other lack of coordination  Multiple sclerosis (HCC)  Rationale for Evaluation and Treatment: Rehabilitation  SUBJECTIVE:   SUBJECTIVE STATEMENT:  Today is patient's 22st birthday!!  Patient reports that he has numbness and sometimes tingling in his hands (L>R), torso and feet.  These sensory problems affect/interrupt his sleep.  He does report that his legs/feet are doing a bit better since his last hospitalization.  Patient reports that it feels weird to hold stuff and that he has trouble grabbing stuff sometimes.   Pt accompanied by: self - Patient's mother dropped him off and an Benedetto Goad was going to pick him up.    PERTINENT HISTORY:   Per hospital records (7/12 - 7/15) PMHx:  significant for asthma and recently diagnosed MS who had not yet started treatment presenting with worsening neurologic symptoms including blurry vision left greater than right with diffuse extremity paresthesias.  Symptoms resolved/returned to baseline after 3 doses of IV solumedrol. Neurology has reevaluated the patient, recommending discharge home with plans to complete 5 days as an outpatient at the infusion center. He was reccommended to follow up with his neurologist, Dr. Epimenio Foot, after discharge with plans to start kesimpta.  PRECAUTIONS: None  WEIGHT BEARING RESTRICTIONS: No  PAIN:  Are you having pain?  At eval No - 0/10 but coming for therapy because he does have sensory changes and pain ie) Yes: NPRS scale: 5/10 Pain location: L arm Pain description: feels tight ie) along arm to pinkie Aggravating factors: when he lays down Relieving factors: rub it, putty  FALLS: Has patient fallen in last 6 months? No had some stumbling between hospitalizations last month but no falls  LIVING ENVIRONMENT: Lives with: lives with their family - mother, 2 sisters and brother (patient is oldest of 4 - 2 siblings in HS and 1 in middle school) Lives in: House/apartment Stairs: No Has following equipment at home: None  PLOF: Independent, was driving but not much lately. It has been a year since he worked (at [employer])  PATIENT GOALS: Patient reported wanting to try and get my sensation back.    OBJECTIVE:   HAND DOMINANCE:  Right  ADLs: Overall ADLs: Independent Tub-Shower transfers: Ind into tub/shower Equipment: none  IADLs: Shopping: Pt helps with shopping, he will take siblings with him,  Light housekeeping: Helps with laundry but finds the warm laundry room off the garage makes him feel exhausted Meal Prep: Mother completes Community mobility: Indep - no AE Medication management: Indep Financial management: NA - not  working - mother handles home finances Handwriting: During his flare up he couldn't really write but that has improved  MOBILITY STATUS: Independent  POSTURE COMMENTS:  No Significant postural limitations Sitting balance: WFL  ACTIVITY TOLERANCE: Activity tolerance: Low by self report - gets tired way faster even when he is not really doing anything  FUNCTIONAL OUTCOME MEASURES: Quick Dash: 22.7  UPPER EXTREMITY ROM:    Active ROM Right eval Left eval  Shoulder flexion WFL but self-reported his arms feeling weird in full extension overhead and out to the sides  Shoulder abduction   Shoulder adduction   Shoulder extension   Shoulder internal rotation   Shoulder external rotation   Elbow flexion   Elbow extension   Wrist flexion   Wrist extension   Wrist ulnar deviation   Wrist radial deviation   Wrist pronation   Wrist supination   (Blank rows = not tested)  UPPER EXTREMITY MMT:     MMT Right eval Left eval  Shoulder flexion 4+/5 4/5  Shoulder abduction    Shoulder adduction    Shoulder extension    Shoulder internal rotation    Shoulder external rotation    Middle trapezius    Lower trapezius    Elbow flexion    Elbow extension    Wrist flexion    Wrist extension    Wrist ulnar deviation    Wrist radial deviation    Wrist pronation    Wrist supination    (Blank rows = not tested)  HAND FUNCTION: Grip strength: Right: 74.7, 65.0, 60.4 lbs; Left: 65.6, 53.7, 50.2  lbs Average: Right: 66.7 lbs Left: 56.5 lbs  COORDINATION: 9 Hole Peg test: Right: 33.09  sec; Left: 32.27 sec Box and Blocks:  Right 36 blocks, Left 36 blocks  SENSATION: Light touch: Impaired  L side is dull  EDEMA: NA  MUSCLE TONE: WFL  COGNITION: Overall cognitive status:  self reports short term memory issues  VISION: Subjective report: Feels like his vision is dim - during his flare up his vision has been blurry and with double vision (but has improved after flare up  improved).  Baseline vision:  Wore glasses in elementary but not recently  - no eye dr since elementary Visual history: NA  VISION ASSESSMENT: TBD  Patient has difficulty with following activities due to following visual impairments: Eyes get blurry, has trouble with phone sometimes  PERCEPTION: Not tested  PRAXIS: Not tested  OBSERVATIONS: Pt ambulates into therapy gym with no AE and no loss of balance but movements are slow as is responses. The pt appears well kept and has hoody donned (even pulled over his head) at evaluation. Patient is observed watching himself move each block over the center divider for blocks and box test.   TODAY'S TREATMENT:  DATE: NA   PATIENT EDUCATION: Education details: OT role and POC considerations Person educated: Patient Education method: Explanation Education comprehension: verbalized understanding and needs further education  HOME EXERCISE PROGRAM: TBD   GOALS: Goals reviewed with patient? Yes - briefly but will need to review specific goals  SHORT TERM GOALS: Target date: 08/02/23  Patient will demonstrate UE strength(putty) and coordination HEP with 25% verbal cues or less for proper execution. Baseline: New to OT. Goal status: INITIAL  2.  Patient will be aware of sleep positions to reduce stress to upper extremity nerves and improve comfort and ability to sleep to rate difficultly with sleep as Mild or better on QuickDash. Baseline: Self reports sensory changes that affect his sleep. - QuickDash - Moderate difficulty. Goal status: INITIAL  3.  Patient will demonstrate at least 65 lbs R & 55 lbs L grip strength over 3 trials as needed to open jars and other containers. Baseline: Progressively less strength over 3 trials Grip strengths: Right: 74.7, 65.0, 60.4 lbs; Left: 65.6, 53.7, 50.2  lbs Average: Right: 66.7  lbs Left: 56.5 lbs Goal status: INITIAL  4.  Patient will complete nine-hole peg with use of B UE in 30 seconds or less.  Baseline: 9 Hole Peg test: Right: 33.09  sec; Left: 32.27 sec Goal status: INITIAL  5.  Patient will demonstrate diplopia HEP with 25% verbal cues or less for proper execution. Baseline: reports of double vision with MS flares Goal status: INITIAL  6.  Patient will independently verbalize at least 3 energy conservation principles in relation to ADLs and daily routine to increase functional independence and decrease exhaustion.  Baseline: New MS diagnosis with decreased awareness of precautions. Goal status: INITIAL  LONG TERM GOALS: Target date: 08/30/23  Patient will demonstrate sensory HEP for re/desensitization in order to maximize sensory awareness and minimize sensory deficits. Baseline: Patient reports that he has numbness and tingling in his hands affecting sleep etc. Goal status: INITIAL  2.  Patient will demonstrate at least 70 lbs R & 60 lbs L grip strength over 3 trials as needed to carry groceries etc. Baseline: Progressively less strength over 3 trials Grip strengths: Right: 74.7, 65.0, 60.4 lbs; Left: 65.6, 53.7, 50.2  lbs Average: Right: 66.7 lbs Left: 56.5 lbs Goal status: INITIAL  3.  Pt will be able to place at least 40+ blocks using BUE hand with completion of Box and Blocks test without constant need for visual feedback. Baseline: Box and Blocks:  Right 36 blocks, Left 36 blocks Goal status: INITIAL  4.  Pt will verbalize understanding of ways to prevent future MS related complications and MS community resources. Baseline: not yet initiated Goal status: INITIAL  5.  Pt will verbalize understanding of ways to keep thinking skills sharp and ways to compensate for current STM changes and issues in the future. Baseline: not yet initiated Goal status: INITIAL  6.  Patient will demonstrate at least 5% improvement with quick Dash score (reporting  <20% disability or less) indicating improved functional use of affected extremity.   Baseline: Quick Dash: 22.7  Goal Status: INTIAL  ASSESSMENT:  CLINICAL IMPRESSION: Pt is 21 year old AA male, R hand dominant who presents for occupational therapy eval secondary to recent diagnosis with MS and resulting impairments in strength, coordination and sensation. Hx includes recent hospitalization due to MS flare. Please refer above for additional medical history. Pt reports MI with ADLs but presents with weakness and sensory changes at this time. Pt would  benefit from skilled OT services in the outpatient setting to work on impairments and assist with resources for diagnosis of MS.  Barriers to meeting goals include attendance as patient has already missed 3 scheduled OT evals. Pt to be seen 1-2 x week for 6 weeks.    PERFORMANCE DEFICITS: in functional skills including ADLs, IADLs, coordination, dexterity, proprioception, sensation, strength, pain, Fine motor control, Gross motor control, mobility, balance, endurance, decreased knowledge of precautions, decreased knowledge of use of DME, vision, and UE functional use, cognitive skills including energy/drive and memory, and psychosocial skills including coping strategies, environmental adaptation, habits, and routines and behaviors.   IMPAIRMENTS: are limiting patient from ADLs, IADLs, rest and sleep, work, leisure, and social participation.   CO-MORBIDITIES: may have co-morbidities  that affects occupational performance. Patient will benefit from skilled OT to address above impairments and improve overall function.  MODIFICATION OR ASSISTANCE TO COMPLETE EVALUATION: No modification of tasks or assist necessary to complete an evaluation.  OT OCCUPATIONAL PROFILE AND HISTORY: Problem focused assessment: Including review of records relating to presenting problem.  CLINICAL DECISION MAKING: LOW - limited treatment options, no task modification  necessary  REHAB POTENTIAL: Good  EVALUATION COMPLEXITY: Low    PLAN:  OT FREQUENCY: 1-2x/week  OT DURATION: 6 weeks  PLANNED INTERVENTIONS: self care/ADL training, therapeutic exercise, therapeutic activity, neuromuscular re-education, aquatic therapy, patient/family education, cognitive remediation/compensation, visual/perceptual remediation/compensation, energy conservation, coping strategies training, and DME and/or AE instructions  RECOMMENDED OTHER SERVICES: Patient is scheduled for PT evaluation at the end of the week.  May need eye exam also.  CONSULTED AND AGREED WITH PLAN OF CARE: Patient  PLAN FOR NEXT SESSIONs:  Review Goals Sleep position education/handout More detailed sensory assessment. HEPs - strength/putty, coordination and sensory based Education re: MS, resources, energy conservation Information binder - medical, HEP, resources, information   Check all possible CPT codes: 16109- Therapeutic Exercise, [phone]- Neuro Re-education, [phone] - Therapeutic Activities, [phone] - Self Care, [phone] - Aquatic therapy, [phone] - Cognitive training (First 15 min), and 97130 - Cognitive training (each additional 15 min)    Check all conditions that are expected to impact treatment: {Conditions expected to impact treatment:Neurological condition and/or seizures and Uncorrected hearing or vision impairment   If treatment provided at initial evaluation, no treatment charged due to lack of authorization.      Victorino Sparrow, OT 07/02/2023, 5:22 PM

## 2023-07-05 ENCOUNTER — Other Ambulatory Visit: Payer: Self-pay

## 2023-07-05 ENCOUNTER — Ambulatory Visit: Payer: Medicaid Other | Attending: Neurology

## 2023-07-05 DIAGNOSIS — M6281 Muscle weakness (generalized): Secondary | ICD-10-CM | POA: Diagnosis present

## 2023-07-05 DIAGNOSIS — G35 Multiple sclerosis: Secondary | ICD-10-CM | POA: Insufficient documentation

## 2023-07-05 DIAGNOSIS — R2689 Other abnormalities of gait and mobility: Secondary | ICD-10-CM | POA: Insufficient documentation

## 2023-07-05 DIAGNOSIS — R208 Other disturbances of skin sensation: Secondary | ICD-10-CM | POA: Diagnosis present

## 2023-07-05 DIAGNOSIS — R269 Unspecified abnormalities of gait and mobility: Secondary | ICD-10-CM | POA: Insufficient documentation

## 2023-07-05 DIAGNOSIS — R278 Other lack of coordination: Secondary | ICD-10-CM | POA: Diagnosis present

## 2023-07-05 NOTE — Therapy (Signed)
OUTPATIENT PHYSICAL THERAPY NEURO EVALUATION   Patient Name: Calvin Ward MRN: [id number] DOB:02-23-2001, 22 y.o., male Today's Date: 07/05/2023   PCP: N/A REFERRING PROVIDER: Dr. Despina Arias  END OF SESSION:  PT End of Session - 07/05/23 1410     Visit Number 1    Number of Visits 8             Past Medical History:  Diagnosis Date   Asthma    MS (multiple sclerosis) (HCC)    History reviewed. No pertinent surgical history. Patient Active Problem List   Diagnosis Date Noted   Exacerbation of multiple sclerosis (HCC) 06/15/2023   Vitamin D deficiency 05/18/2023   Multiple sclerosis (HCC) 05/17/2023   Generalized abdominal pain 05/04/2019    ONSET DATE: 05/19/2023 - recent flare up  REFERRING DIAG: G35 (ICD-10-CM) - Multiple sclerosis (HCC) R26.9 (ICD-10-CM) - Gait disturbance  THERAPY DIAG:  Muscle weakness (generalized)  Other abnormalities of gait and mobility  Rationale for Evaluation and Treatment: Rehabilitation  SUBJECTIVE:                                                                                                                                                                                             SUBJECTIVE STATEMENT: Pt reports he was recently diagnosed with MS in June 2024. Pt reports symptoms have been going on for about 1 year. His first flare up was in 05/2022.  Pt accompanied by: self  PERTINENT HISTORY: none significant  PAIN:  Are you having pain? Yes: NPRS scale: 5/10 Pain location: L hand Pain description: tight Aggravating factors: lying down Relieving factors: rest, massage  PRECAUTIONS: None  RED FLAGS: None   WEIGHT BEARING RESTRICTIONS: No  FALLS: Has patient fallen in last 6 months? No  LIVING ENVIRONMENT: Lives with: lives with their family Lives in: House/apartment Stairs: No Has following equipment at home: None  PLOF: Independent  PATIENT GOALS: Mostly working on arms.  OBJECTIVE:    COGNITION: Overall cognitive status: Within functional limits for tasks assessed   SENSATION: Light touch: Impaired  Hot/Cold: Impaired  bil UE per pt report  CLOWER EXTREMITY ROM:     Active  Right Eval Left Eval  Hip flexion    Hip extension    Hip abduction    Hip adduction    Hip internal rotation    Hip external rotation    Knee flexion    Knee extension    Ankle dorsiflexion    Ankle plantarflexion    Ankle inversion    Ankle eversion     (Blank rows = not tested)  LOWER EXTREMITY MMT:  MMT Right Eval Left Eval  Hip flexion    Hip extension    Hip abduction    Hip adduction    Hip internal rotation    Hip external rotation    Knee flexion    Knee extension    Ankle dorsiflexion    Ankle plantarflexion    Ankle inversion    Ankle eversion    (Blank rows = not tested)  GAIT: Gait pattern: WFL Distance walked: 460' Assistive device utilized: None Level of assistance: Complete Independence   FUNCTIONAL TESTS:  Functional gait assessment: 30/30 MCTSIB: Condition 1: Avg of 3 trials: 30 sec, Condition 2: Avg of 3 trials: 30 sec, Condition 3: Avg of 3 trials: 30 sec, Condition 4: Avg of 3 trials: 30 sec, and Total Score: 120/120 SLS: 15 sec R and L (PT only tested up to 15 sec)  TREATMENT:                                                                                                                             PATIENT EDUCATION: Education details: Pt reviewed objective assessment and discussed how his Strength in his LE and core is WNL and his balance is [facility].  We discussed MS pathology, fatigue management, temperature management. We discussed doing regular exercises at the gym 15 min of cardio and 15-45 min of strength training as tolerated.  Person educated: Patient Education method: Explanation Education comprehension: verbalized understanding  HOME EXERCISE PROGRAM: none  GOALS:no goals established as today is one time  visit.  ASSESSMENT:  CLINICAL IMPRESSION: Patient is a 22 y.o. male who was seen today for physical therapy evaluation and treatment for gait and mobility assessment after recently diagnosed with MS. Objective testing revealed that patient's strength, balance and gait is WNL. Patient currently does not required skilled PT.   OBJECTIVE IMPAIRMENTS:  none .   ACTIVITY LIMITATIONS:  none  PARTICIPATION LIMITATIONS:  none  PERSONAL FACTORS: Time since onset of injury/illness/exacerbation are also affecting patient's functional outcome.   REHAB POTENTIAL: Excellent  CLINICAL DECISION MAKING: Stable/uncomplicated  EVALUATION COMPLEXITY: Low  PLAN:  PT FREQUENCY: one time visit   Ileana Ladd, PT 07/05/2023, 2:14 PM

## 2023-07-15 ENCOUNTER — Encounter: Payer: Self-pay | Admitting: Neurology

## 2023-07-22 ENCOUNTER — Ambulatory Visit: Payer: Medicaid Other | Admitting: Occupational Therapy

## 2023-07-22 NOTE — Therapy (Deleted)
OUTPATIENT OCCUPATIONAL THERAPY NEURO TREATMENT  Patient Name: Calvin Ward MRN: [id number] DOB:[birth year], 22 y.o., male Today's Date: 07/22/2023  PCP: Alberteen Sam, MD REFERRING PROVIDER: Alberteen Sam, MD  END OF SESSION:    Past Medical History:  Diagnosis Date   Asthma    MS (multiple sclerosis) (HCC)    No past surgical history on file. Patient Active Problem List   Diagnosis Date Noted   Exacerbation of multiple sclerosis (HCC) 06/15/2023   Vitamin D deficiency 05/18/2023   Multiple sclerosis (HCC) 05/17/2023   Generalized abdominal pain 05/04/2019    ONSET DATE: 05/20/2023  REFERRING DIAG: G35 (ICD-10-CM) - Multiple sclerosis (HCC)  THERAPY DIAG:  No diagnosis found.  Rationale for Evaluation and Treatment: Rehabilitation  SUBJECTIVE:   SUBJECTIVE STATEMENT:  Today is patient's 22st birthday!!  Patient reports that he has numbness and sometimes tingling in his hands (L>R), torso and feet.  These sensory problems affect/interrupt his sleep.  He does report that his legs/feet are doing a bit better since his last hospitalization.  Patient reports that it feels weird to hold stuff and that he has trouble grabbing stuff sometimes.   Pt accompanied by: self - Patient's mother dropped him off and an Benedetto Goad was going to pick him up.   PERTINENT HISTORY:   Per hospital records (7/12 - 7/15) PMHx:  significant for asthma and recently diagnosed MS who had not yet started treatment presenting with worsening neurologic symptoms including blurry vision left greater than right with diffuse extremity paresthesias.  Symptoms resolved/returned to baseline after 3 doses of IV solumedrol. Neurology has reevaluated the patient, recommending discharge home with plans to complete 5 days as an outpatient at the infusion center. He was reccommended to follow up with his neurologist, Dr. Epimenio Foot, after discharge with plans to start kesimpta.  PRECAUTIONS:  None  WEIGHT BEARING RESTRICTIONS: No  PAIN:  Are you having pain?  At eval No - 0/10 but coming for therapy because he does have sensory changes and pain ie) Yes: NPRS scale: 5/10 Pain location: L arm Pain description: feels tight ie) along arm to pinkie Aggravating factors: when he lays down Relieving factors: rub it, putty  FALLS: Has patient fallen in last 6 months? No had some stumbling between hospitalizations last month but no falls  LIVING ENVIRONMENT: Lives with: lives with their family - mother, 2 sisters and brother (patient is oldest of 4 - 2 siblings in HS and 1 in middle school) Lives in: House/apartment Stairs: No Has following equipment at home: None  PLOF: Independent, was driving but not much lately. It has been a year since he worked (at [employer])  PATIENT GOALS: Patient reported wanting to try and get my sensation back.    OBJECTIVE:   HAND DOMINANCE: Right  ADLs: Overall ADLs: Independent Tub-Shower transfers: Ind into tub/shower Equipment: none  IADLs: Shopping: Pt helps with shopping, he will take siblings with him,  Light housekeeping: Helps with laundry but finds the warm laundry room off the garage makes him feel exhausted Meal Prep: Mother completes Community mobility: Indep - no AE Medication management: Indep Financial management: NA - not working - mother handles home finances Handwriting: During his flare up he couldn't really write but that has improved  MOBILITY STATUS: Independent  POSTURE COMMENTS:  No Significant postural limitations Sitting balance: WFL  ACTIVITY TOLERANCE: Activity tolerance: Low by self report - gets tired way faster even when he is not really doing anything  FUNCTIONAL OUTCOME MEASURES: Quick Dash:  22.7  UPPER EXTREMITY ROM:    Active ROM Right eval Left eval  Shoulder flexion WFL but self-reported his arms feeling weird in full extension overhead and out to the sides  Shoulder abduction   Shoulder  adduction   Shoulder extension   Shoulder internal rotation   Shoulder external rotation   Elbow flexion   Elbow extension   Wrist flexion   Wrist extension   Wrist ulnar deviation   Wrist radial deviation   Wrist pronation   Wrist supination   (Blank rows = not tested)  UPPER EXTREMITY MMT:     MMT Right eval Left eval  Shoulder flexion 4+/5 4/5  Shoulder abduction    Shoulder adduction    Shoulder extension    Shoulder internal rotation    Shoulder external rotation    Middle trapezius    Lower trapezius    Elbow flexion    Elbow extension    Wrist flexion    Wrist extension    Wrist ulnar deviation    Wrist radial deviation    Wrist pronation    Wrist supination    (Blank rows = not tested)  HAND FUNCTION: Grip strength: Right: 74.7, 65.0, 60.4 lbs; Left: 65.6, 53.7, 50.2  lbs Average: Right: 66.7 lbs Left: 56.5 lbs  COORDINATION: 9 Hole Peg test: Right: 33.09  sec; Left: 32.27 sec Box and Blocks:  Right 36 blocks, Left 36 blocks  SENSATION: Light touch: Impaired  L side is dull  EDEMA: NA  MUSCLE TONE: WFL  COGNITION: Overall cognitive status:  self reports short term memory issues  VISION: Subjective report: Feels like his vision is dim - during his flare up his vision has been blurry and with double vision (but has improved after flare up improved).  Baseline vision:  Wore glasses in elementary but not recently  - no eye dr since elementary Visual history: NA  VISION ASSESSMENT: TBD  Patient has difficulty with following activities due to following visual impairments: Eyes get blurry, has trouble with phone sometimes  PERCEPTION: Not tested  PRAXIS: Not tested  OBSERVATIONS: Pt ambulates into therapy gym with no AE and no loss of balance but movements are slow as is responses. The pt appears well kept and has hoody donned (even pulled over his head) at evaluation. Patient is observed watching himself move each block over the center divider  for blocks and box test.   TODAY'S TREATMENT:                                                                                                                              DATE: NA   PATIENT EDUCATION: Education details: OT role and POC considerations Person educated: Patient Education method: Explanation Education comprehension: verbalized understanding and needs further education  HOME EXERCISE PROGRAM: TBD   GOALS: Goals reviewed with patient? Yes - briefly but will need to review specific goals  SHORT TERM GOALS: Target date: 08/02/23  Patient will demonstrate UE strength(putty) and coordination HEP with 25% verbal cues or less for proper execution. Baseline: New to OT. Goal status: INITIAL  2.  Patient will be aware of sleep positions to reduce stress to upper extremity nerves and improve comfort and ability to sleep to rate difficultly with sleep as Mild or better on QuickDash. Baseline: Self reports sensory changes that affect his sleep. - QuickDash - Moderate difficulty. Goal status: INITIAL  3.  Patient will demonstrate at least 65 lbs R & 55 lbs L grip strength over 3 trials as needed to open jars and other containers. Baseline: Progressively less strength over 3 trials Grip strengths: Right: 74.7, 65.0, 60.4 lbs; Left: 65.6, 53.7, 50.2  lbs Average: Right: 66.7 lbs Left: 56.5 lbs Goal status: INITIAL  4.  Patient will complete nine-hole peg with use of B UE in 30 seconds or less.  Baseline: 9 Hole Peg test: Right: 33.09  sec; Left: 32.27 sec Goal status: INITIAL  5.  Patient will demonstrate diplopia HEP with 25% verbal cues or less for proper execution. Baseline: reports of double vision with MS flares Goal status: INITIAL  6.  Patient will independently verbalize at least 3 energy conservation principles in relation to ADLs and daily routine to increase functional independence and decrease exhaustion.  Baseline: New MS diagnosis with decreased awareness of  precautions. Goal status: INITIAL  LONG TERM GOALS: Target date: 08/30/23  Patient will demonstrate sensory HEP for re/desensitization in order to maximize sensory awareness and minimize sensory deficits. Baseline: Patient reports that he has numbness and tingling in his hands affecting sleep etc. Goal status: INITIAL  2.  Patient will demonstrate at least 70 lbs R & 60 lbs L grip strength over 3 trials as needed to carry groceries etc. Baseline: Progressively less strength over 3 trials Grip strengths: Right: 74.7, 65.0, 60.4 lbs; Left: 65.6, 53.7, 50.2  lbs Average: Right: 66.7 lbs Left: 56.5 lbs Goal status: INITIAL  3.  Pt will be able to place at least 40+ blocks using BUE hand with completion of Box and Blocks test without constant need for visual feedback. Baseline: Box and Blocks:  Right 36 blocks, Left 36 blocks Goal status: INITIAL  4.  Pt will verbalize understanding of ways to prevent future MS related complications and MS community resources. Baseline: not yet initiated Goal status: INITIAL  5.  Pt will verbalize understanding of ways to keep thinking skills sharp and ways to compensate for current STM changes and issues in the future. Baseline: not yet initiated Goal status: INITIAL  6.  Patient will demonstrate at least 5% improvement with quick Dash score (reporting <20% disability or less) indicating improved functional use of affected extremity.   Baseline: Quick Dash: 22.7  Goal Status: INTIAL  ASSESSMENT:  CLINICAL IMPRESSION: ***  PERFORMANCE DEFICITS: in functional skills including ADLs, IADLs, coordination, dexterity, proprioception, sensation, strength, pain, Fine motor control, Gross motor control, mobility, balance, endurance, decreased knowledge of precautions, decreased knowledge of use of DME, vision, and UE functional use, cognitive skills including energy/drive and memory, and psychosocial skills including coping strategies, environmental adaptation,  habits, and routines and behaviors.   IMPAIRMENTS: are limiting patient from ADLs, IADLs, rest and sleep, work, leisure, and social participation.   CO-MORBIDITIES: may have co-morbidities  that affects occupational performance. Patient will benefit from skilled OT to address above impairments and improve overall function.  REHAB POTENTIAL: Good  PLAN:  OT FREQUENCY: 1-2x/week  OT DURATION: 6 weeks  PLANNED INTERVENTIONS:  self care/ADL training, therapeutic exercise, therapeutic activity, neuromuscular re-education, aquatic therapy, patient/family education, cognitive remediation/compensation, visual/perceptual remediation/compensation, energy conservation, coping strategies training, and DME and/or AE instructions  RECOMMENDED OTHER SERVICES: Patient is scheduled for PT evaluation at the end of the week.  May need eye exam also.  CONSULTED AND AGREED WITH PLAN OF CARE: Patient  PLAN FOR NEXT SESSIONs:  Review Goals Sleep position education/handout More detailed sensory assessment. HEPs - strength/putty, coordination and sensory based Education re: MS, resources, energy conservation Information binder - medical, HEP, resources, information   Check all possible CPT codes: 16109- Therapeutic Exercise, [phone]- Neuro Re-education, [phone] - Therapeutic Activities, [phone] - Self Care, [phone] - Aquatic therapy, [phone] - Cognitive training (First 15 min), and [id number] - Cognitive training (each additional 15 min)    Check all conditions that are expected to impact treatment: {Conditions expected to impact treatment:Neurological condition and/or seizures and Uncorrected hearing or vision impairment   If treatment provided at initial evaluation, no treatment charged due to lack of authorization.      Delana Meyer, OT 07/22/2023, 10:33 AM

## 2023-07-25 ENCOUNTER — Ambulatory Visit: Payer: Medicaid Other | Admitting: Occupational Therapy

## 2023-07-29 ENCOUNTER — Ambulatory Visit: Payer: Medicaid Other | Admitting: Occupational Therapy

## 2023-07-29 DIAGNOSIS — M6281 Muscle weakness (generalized): Secondary | ICD-10-CM | POA: Diagnosis not present

## 2023-07-29 DIAGNOSIS — R208 Other disturbances of skin sensation: Secondary | ICD-10-CM

## 2023-07-29 DIAGNOSIS — R278 Other lack of coordination: Secondary | ICD-10-CM

## 2023-07-29 NOTE — Therapy (Signed)
OUTPATIENT OCCUPATIONAL THERAPY NEURO TREATMENT  Patient Name: Calvin Ward MRN: [id number] DOB:04/22/2001, 22 y.o., male Today's Date: 07/29/2023  PCP: Alberteen Sam, MD REFERRING PROVIDER: Alberteen Sam, MD  END OF SESSION:  OT End of Session - 07/29/23 1022     Visit Number 2    Number of Visits 12   + evaluation   Date for OT Re-Evaluation 08/30/23    Authorization Type [place] [employer]    OT Start Time 1022    OT Stop Time 1101    OT Time Calculation (min) 39 min    Equipment Utilized During Treatment FM objects and Pink Putty    Activity Tolerance Patient tolerated treatment well    Behavior During Therapy WFL for tasks assessed/performed             Past Medical History:  Diagnosis Date   Asthma    MS (multiple sclerosis) (HCC)    No past surgical history on file. Patient Active Problem List   Diagnosis Date Noted   Exacerbation of multiple sclerosis (HCC) 06/15/2023   Vitamin D deficiency 05/18/2023   Multiple sclerosis (HCC) 05/17/2023   Generalized abdominal pain 05/04/2019    ONSET DATE: 05/20/2023  REFERRING DIAG: G35 (ICD-10-CM) - Multiple sclerosis (HCC)  THERAPY DIAG:  Muscle weakness (generalized)  Other lack of coordination  Other disturbances of skin sensation  Rationale for Evaluation and Treatment: Rehabilitation  SUBJECTIVE:   SUBJECTIVE STATEMENT:  Patient is still numb in L hand but the feeling is coming back in his R hand, although the coordination in the R side feels weird still.    Pt reports a prescription not on his list that helps with sleep.  He also takes several vitamins.  Pt reports he is not planning to miss any upcoming appts and that he was bummed to miss his previous appointments.   Pt accompanied by: self - Patient's dropped him off and an Benedetto Goad was going to pick him up.   PERTINENT HISTORY:   Per hospital records (7/12 - 7/15) PMHx:  significant for asthma and recently diagnosed MS  who had not yet started treatment presenting with worsening neurologic symptoms including blurry vision left greater than right with diffuse extremity paresthesias.  Symptoms resolved/returned to baseline after 3 doses of IV solumedrol. Neurology has reevaluated the patient, recommending discharge home with plans to complete 5 days as an outpatient at the infusion center. He was reccommended to follow up with his neurologist, Dr. Epimenio Foot, after discharge with plans to start kesimpta.  PRECAUTIONS: None  WEIGHT BEARING RESTRICTIONS: No  PAIN:  Are you having pain?   No  FALLS: Has patient fallen in last 6 months? No had some stumbling between hospitalizations last month but no falls  LIVING ENVIRONMENT: Lives with: lives with their family - mother, 2 sisters and brother (patient is oldest of 4 - 2 siblings in HS and 1 in middle school) Lives in: House/apartment Stairs: No Has following equipment at home: None  PLOF: Independent, was driving but not much lately. It has been a year since he worked (at [employer])  PATIENT GOALS: Patient reported wanting to try and get my sensation back.    OBJECTIVE:   HAND DOMINANCE: Right  ADLs: Overall ADLs: Independent Tub-Shower transfers: Ind into tub/shower Equipment: none  IADLs: Shopping: Pt helps with shopping, he will take siblings with him,  Light housekeeping: Helps with laundry but finds the warm laundry room off the garage makes him feel exhausted Meal Prep: Mother  completes Community mobility: Indep - no AE Medication management: Indep Financial management: NA - not working - mother handles home finances Handwriting: During his flare up he couldn't really write but that has improved  MOBILITY STATUS: Independent  POSTURE COMMENTS:  No Significant postural limitations Sitting balance: WFL  ACTIVITY TOLERANCE: Activity tolerance: Low by self report - gets tired way faster even when he is not really doing anything  FUNCTIONAL OUTCOME  MEASURES: Quick Dash: 22.7  - see image in eval PW  UPPER EXTREMITY ROM:    Active ROM Right eval Left eval  Shoulder flexion WFL but self-reported his arms feeling weird in full extension overhead and out to the sides  Shoulder abduction   Shoulder adduction   Shoulder extension   Shoulder internal rotation   Shoulder external rotation   Elbow flexion   Elbow extension   Wrist flexion   Wrist extension   Wrist ulnar deviation   Wrist radial deviation   Wrist pronation   Wrist supination   (Blank rows = not tested)  UPPER EXTREMITY MMT:     MMT Right eval Left eval  Shoulder flexion 4+/5 4/5  Shoulder abduction    Shoulder adduction    Shoulder extension    Shoulder internal rotation    Shoulder external rotation    Middle trapezius    Lower trapezius    Elbow flexion    Elbow extension    Wrist flexion    Wrist extension    Wrist ulnar deviation    Wrist radial deviation    Wrist pronation    Wrist supination    (Blank rows = not tested)  HAND FUNCTION: Grip strength: Right: 74.7, 65.0, 60.4 lbs; Left: 65.6, 53.7, 50.2  lbs Average: Right: 66.7 lbs Left: 56.5 lbs  COORDINATION: 9 Hole Peg test: Right: 33.09  sec; Left: 32.27 sec Box and Blocks:  Right 36 blocks, Left 36 blocks  SENSATION: Light touch: Impaired  L side is dull  EDEMA: NA  MUSCLE TONE: WFL  COGNITION: Overall cognitive status:  self reports short term memory issues  VISION: Subjective report: Feels like his vision is dim - during his flare up his vision has been blurry and with double vision (but has improved after flare up improved).  Baseline vision:  Wore glasses in elementary but not recently  - no eye dr since elementary Visual history: NA  VISION ASSESSMENT: TBD  Patient has difficulty with following activities due to following visual impairments: Eyes get blurry, has trouble with phone sometimes  PERCEPTION: Not tested  PRAXIS: Not tested  Evaluation OBSERVATIONS:  Pt ambulates into therapy gym with no AE and no loss of balance but movements are slow as is responses. The pt appears well kept and has hoody donned (even pulled over his head) at evaluation. Patient is observed watching himself move each block over the center divider for blocks and box test.   TODAY'S TREATMENT:  Therapeutic Activities   Coordination Activity handout with images provided for various activities to work on B UE finger ROM, dexterity and isolated movements with demonstration and practice, as well as modification, hand over hand guidance and cues throughout to improve technique, digital isolation and ease of performing task.    Rotate 2 balls in hand (clockwise and counter-clockwise) in hand (forearm supinated) and on table top (forearm pronated).   Cards - shuffling, dealing, sorting and flipping cards 1 at a time.    Picking up small objects ie) coins, dominoes, buttons, marbles, dried beans/pasta of different sizes etc To place in containers To stack  To pick up items one at a time until he gets 5-10+ items in his hand and then move item from palm to fingertips to release.     Twirl pen/cil between fingers. - Encouragement to isolate fingers individually and flip the pen/cil or move fingers up and down pen to get it in position for writing or erasing.   Tear a piece of paper towel and roll it into small balls.    Patient is encourage to take breaks, relax shoulder, minimize compensatory motions and a try different activities throughout the week.     Initiated Putty Activities with pink (medium) putty to work on [employer], digital isolation and increased sensory awareness. Patient provided visual demo, verbal and tactile cues as well as hand over hand guidance to improve performance of the following exercises.  - Putty Squeezes  - 1 x  daily - 10 reps - Rolling Putty on Table  - 1 x daily - 10 reps - 3-Point Pinch with Putty  - 1 x daily - 10 reps - Tip PUSH with Putty  - 1 x daily - 10 reps - Key Pinch with Putty  - 1 x daily - 10 reps - Finger Pinch and Pull with Putty  - 1 x daily - 10 reps - Removing Marbles from Putty  - 1 x daily - 10 reps    - Putty Squeezes & Rolling Putty on Table -  cues and encouragement to relax shoulder and elbow and focus on gently squeezing putty into log to then roll on the table   - 3-Point Pinch with Putty, Tip PUSH with Putty & Key Pinch with Putty - cues to work on different pinches ie) individual fingers, tripod and/or lateral pinch to index finger with "pinch and pull" motion of putty   -Removing objects from putty-patient encouraged to use different items i.e. coins marbles dice etc. and to try and locate and identify objects before visually being able to see the item.  PATIENT EDUCATION: Education details: OT goals, coordination activities and putty exercises Person educated: Patient Education method: Explanation, Demonstration, Tactile cues, Verbal cues, and Handouts Education comprehension: verbalized understanding, returned demonstration, verbal cues required, tactile cues required, and needs further education  HOME EXERCISE PROGRAM:  07/29/23: Coordination with images; Putty Exercises Access Code: [email or web address]   GOALS: Goals reviewed with patient? Yes - briefly but will need to review specific goals  SHORT TERM GOALS: Target date: 08/02/23  Patient will demonstrate UE strength(putty) and coordination HEP with 25% verbal cues or less for proper execution. Baseline: New to OT. Goal status: IN Progress  2.  Patient will be aware of sleep positions to reduce stress to upper extremity nerves and improve comfort and ability to sleep to rate difficultly with sleep as Mild or better on QuickDash. Baseline: Self reports sensory changes that affect his sleep. Berneda Rose -  Moderate  difficulty. Goal status: INITIAL  3.  Patient will demonstrate at least 65 lbs R & 55 lbs L grip strength over 3 trials as needed to open jars and other containers. Baseline: Progressively less strength over 3 trials Grip strengths: Right: 74.7, 65.0, 60.4 lbs; Left: 65.6, 53.7, 50.2  lbs Average: Right: 66.7 lbs Left: 56.5 lbs Goal status: IN Progress  4.  Patient will complete nine-hole peg with use of B UE in 30 seconds or less.  Baseline: 9 Hole Peg test: Right: 33.09  sec; Left: 32.27 sec Goal status: IN Progress  5.  Patient will demonstrate diplopia HEP with 25% verbal cues or less for proper execution. Baseline: reports of double vision with MS flares Goal status: INITIAL  6.  Patient will independently verbalize at least 3 energy conservation principles in relation to ADLs and daily routine to increase functional independence and decrease exhaustion.  Baseline: New MS diagnosis with decreased awareness of precautions. Goal status: INITIAL  LONG TERM GOALS: Target date: 08/30/23  Patient will demonstrate sensory HEP for re/desensitization in order to maximize sensory awareness and minimize sensory deficits. Baseline: Patient reports that he has numbness and tingling in his hands affecting sleep etc. Goal status: IN Progress  2.  Patient will demonstrate at least 70 lbs R & 60 lbs L grip strength over 3 trials as needed to carry groceries etc. Baseline: Progressively less strength over 3 trials Grip strengths: Right: 74.7, 65.0, 60.4 lbs; Left: 65.6, 53.7, 50.2  lbs Average: Right: 66.7 lbs Left: 56.5 lbs Goal status: IN Progress  3.  Pt will be able to place at least 40+ blocks using BUE hand with completion of Box and Blocks test without constant need for visual feedback. Baseline: Box and Blocks:  Right 36 blocks, Left 36 blocks Goal status: IN Progress  4.  Pt will verbalize understanding of ways to prevent future MS related complications and MS community  resources. Baseline: not yet initiated Goal status: INITIAL  5.  Pt will verbalize understanding of ways to keep thinking skills sharp and ways to compensate for current STM changes and issues in the future. Baseline: not yet initiated Goal status: IN Progress  6.  Patient will demonstrate at least 5% improvement with quick Dash score (reporting <20% disability or less) indicating improved functional use of affected extremity.   Baseline: Quick Dash: 22.7  Goal Status: INTIAL  ASSESSMENT:  CLINICAL IMPRESSION: Pt presents for occupational therapy treatment for recent diagnosis of MS and resulting impairments in strength, coordination and sensation. Pt responded well to interventions today for coordination, strength and sensory awareness of BUEs with good variety of activities available for at home carryover.  Pt will benefit from ongoing skilled OT services to progress motor skills and provide resources as needed.  PERFORMANCE DEFICITS: in functional skills including ADLs, IADLs, coordination, dexterity, proprioception, sensation, strength, pain, Fine motor control, Gross motor control, mobility, balance, endurance, decreased knowledge of precautions, decreased knowledge of use of DME, vision, and UE functional use, cognitive skills including energy/drive and memory, and psychosocial skills including coping strategies, environmental adaptation, habits, and routines and behaviors.   IMPAIRMENTS: are limiting patient from ADLs, IADLs, rest and sleep, work, leisure, and social participation.   CO-MORBIDITIES: may have co-morbidities  that affects occupational performance. Patient will benefit from skilled OT to address above impairments and improve overall function.  REHAB POTENTIAL: Good   PLAN:  OT FREQUENCY: 1-2x/week  OT DURATION: 6 weeks  PLANNED INTERVENTIONS: self care/ADL training, therapeutic  exercise, therapeutic activity, neuromuscular re-education, aquatic therapy,  patient/family education, cognitive remediation/compensation, visual/perceptual remediation/compensation, energy conservation, coping strategies training, and DME and/or AE instructions  RECOMMENDED OTHER SERVICES: Patient is scheduled for PT evaluation at the end of the week.  May need eye exam also.  CONSULTED AND AGREED WITH PLAN OF CARE: Patient  PLAN FOR NEXT SESSIONs:   Review HEPs - strength/putty, coordination  Sleep position education/handout and sensory comp handouts More detailed sensory and VISION assessment. Education re: MS, resources, energy conservation Progress information binder - medical, HEP, resources, information    Victorino Sparrow, OT 07/29/2023, 11:19 AM

## 2023-07-29 NOTE — Patient Instructions (Signed)
Coordination and Putty Exercises    Putty Exercises: Access Code: [email or web address] URL: [email or web address] Date: 07/29/2023 Prepared by: Amada Kingfisher  Exercises - Putty Squeezes  - 1 x daily - 10 reps - Rolling Putty on Table  - 1 x daily - 10 reps - 3-Point Pinch with Putty  - 1 x daily - 10 reps - Tip PUSH with Putty  - 1 x daily - 10 reps - Key Pinch with Putty  - 1 x daily - 10 reps - Finger Pinch and Pull with Putty  - 1 x daily - 10 reps - Removing Marbles from Putty  - 1 x daily - 10 reps

## 2023-07-31 ENCOUNTER — Ambulatory Visit: Payer: Medicaid Other | Admitting: Occupational Therapy

## 2023-08-06 ENCOUNTER — Ambulatory Visit: Payer: Medicaid Other | Attending: Family Medicine | Admitting: Occupational Therapy

## 2023-08-06 DIAGNOSIS — R41842 Visuospatial deficit: Secondary | ICD-10-CM | POA: Insufficient documentation

## 2023-08-06 DIAGNOSIS — R41844 Frontal lobe and executive function deficit: Secondary | ICD-10-CM | POA: Insufficient documentation

## 2023-08-06 DIAGNOSIS — R278 Other lack of coordination: Secondary | ICD-10-CM | POA: Insufficient documentation

## 2023-08-06 DIAGNOSIS — R208 Other disturbances of skin sensation: Secondary | ICD-10-CM | POA: Insufficient documentation

## 2023-08-06 DIAGNOSIS — M6281 Muscle weakness (generalized): Secondary | ICD-10-CM | POA: Insufficient documentation

## 2023-08-07 ENCOUNTER — Telehealth: Payer: Self-pay | Admitting: Occupational Therapy

## 2023-08-07 NOTE — Telephone Encounter (Signed)
Mother contacted this morning re: patient's missed appt yesterday.  She was aware of his missed appt last week but not of his appt yesterday.  She was reminded of his appointment tomorrow and that there are 2 next week.  Call disconnected after information provided.

## 2023-08-08 ENCOUNTER — Ambulatory Visit: Payer: Medicaid Other | Admitting: Occupational Therapy

## 2023-08-12 ENCOUNTER — Ambulatory Visit: Payer: Medicaid Other | Admitting: Occupational Therapy

## 2023-08-12 DIAGNOSIS — M6281 Muscle weakness (generalized): Secondary | ICD-10-CM

## 2023-08-12 DIAGNOSIS — R278 Other lack of coordination: Secondary | ICD-10-CM | POA: Diagnosis present

## 2023-08-12 DIAGNOSIS — R41844 Frontal lobe and executive function deficit: Secondary | ICD-10-CM | POA: Diagnosis present

## 2023-08-12 DIAGNOSIS — R208 Other disturbances of skin sensation: Secondary | ICD-10-CM | POA: Diagnosis present

## 2023-08-12 DIAGNOSIS — R41842 Visuospatial deficit: Secondary | ICD-10-CM | POA: Diagnosis present

## 2023-08-12 NOTE — Therapy (Unsigned)
OUTPATIENT OCCUPATIONAL THERAPY NEURO TREATMENT  Patient Name: Calvin Ward MRN: [id number] DOB:[birth year], 22 y.o., male Today's Date: 08/12/2023  PCP: Alberteen Sam, MD REFERRING PROVIDER: Alberteen Sam, MD  END OF SESSION:  OT End of Session - 08/12/23 1020     Visit Number 3    Number of Visits 12   + evaluation   Date for OT Re-Evaluation 08/30/23    Authorization Type [place] [employer]    OT Start Time 1020    OT Stop Time 1100    OT Time Calculation (min) 40 min    Activity Tolerance Patient tolerated treatment well    Behavior During Therapy WFL for tasks assessed/performed             Past Medical History:  Diagnosis Date   Asthma    MS (multiple sclerosis) (HCC)    No past surgical history on file. Patient Active Problem List   Diagnosis Date Noted   Exacerbation of multiple sclerosis (HCC) 06/15/2023   Vitamin D deficiency 05/18/2023   Multiple sclerosis (HCC) 05/17/2023   Generalized abdominal pain 05/04/2019    ONSET DATE: 05/20/2023  REFERRING DIAG: G35 (ICD-10-CM) - Multiple sclerosis (HCC)  THERAPY DIAG:  Muscle weakness (generalized)  Other lack of coordination  Other disturbances of skin sensation  Rationale for Evaluation and Treatment: Rehabilitation  SUBJECTIVE:   SUBJECTIVE STATEMENT:  Pt reports he is still having trouble falling asleep.  He had missed another appt and OTR contacted his mother who reports he needs afternoon appts.  Pt reports he takes his medicine at 10 PM or so before going to bed but doesn't fall asleep until about 3 AM and is not always able to stay asleep, although it's not related to how his hands are felling.  Pt reports using his putty a lot, especially when he's not doing anything at home.  Pt accompanied by: self   PERTINENT HISTORY:   Per hospital records (7/12 - 7/15) PMHx:  significant for asthma and recently diagnosed MS who had not yet started treatment presenting with  worsening neurologic symptoms including blurry vision left greater than right with diffuse extremity paresthesias.  Symptoms resolved/returned to baseline after 3 doses of IV solumedrol. Neurology has reevaluated the patient, recommending discharge home with plans to complete 5 days as an outpatient at the infusion center. He was reccommended to follow up with his neurologist, Dr. Epimenio Foot, after discharge with plans to start kesimpta.  PRECAUTIONS: None  WEIGHT BEARING RESTRICTIONS: No  PAIN:  Are you having pain?   No  FALLS: Has patient fallen in last 6 months? No had some stumbling between hospitalizations last month but no falls  LIVING ENVIRONMENT: Lives with: lives with their family - mother, 2 sisters and brother (patient is oldest of 4 - 2 siblings in HS and 1 in middle school) Lives in: House/apartment Stairs: No Has following equipment at home: None  PLOF: Independent, was driving but not much lately. It has been a year since he worked (at [employer])  PATIENT GOALS: Patient reported wanting to try and get my sensation back.    OBJECTIVE:   HAND DOMINANCE: Right  ADLs: Overall ADLs: Independent Tub-Shower transfers: Ind into tub/shower Equipment: none  IADLs: Shopping: Pt helps with shopping, he will take siblings with him,  Light housekeeping: Helps with laundry but finds the warm laundry room off the garage makes him feel exhausted Meal Prep: Mother completes Community mobility: Indep - no AE Medication management: Indep Financial management: NA -  not working - mother handles home finances Handwriting: During his flare up he couldn't really write but that has improved  MOBILITY STATUS: Independent  POSTURE COMMENTS:  No Significant postural limitations Sitting balance: WFL  ACTIVITY TOLERANCE: Activity tolerance: Low by self report - gets tired way faster even when he is not really doing anything  FUNCTIONAL OUTCOME MEASURES: Quick Dash: 22.7  - see image in eval  PW  UPPER EXTREMITY ROM:    Active ROM Right eval Left eval  Shoulder flexion WFL but self-reported his arms feeling weird in full extension overhead and out to the sides  Shoulder abduction   Shoulder adduction   Shoulder extension   Shoulder internal rotation   Shoulder external rotation   Elbow flexion   Elbow extension   Wrist flexion   Wrist extension   Wrist ulnar deviation   Wrist radial deviation   Wrist pronation   Wrist supination   (Blank rows = not tested)  UPPER EXTREMITY MMT:     MMT Right eval Left eval  Shoulder flexion 4+/5 4/5  Shoulder abduction    Shoulder adduction    Shoulder extension    Shoulder internal rotation    Shoulder external rotation    Middle trapezius    Lower trapezius    Elbow flexion    Elbow extension    Wrist flexion    Wrist extension    Wrist ulnar deviation    Wrist radial deviation    Wrist pronation    Wrist supination    (Blank rows = not tested)  HAND FUNCTION: Grip strength: Right: 74.7, 65.0, 60.4 lbs; Left: 65.6, 53.7, 50.2  lbs Average: Right: 66.7 lbs Left: 56.5 lbs  COORDINATION: 9 Hole Peg test: Right: 33.09  sec; Left: 32.27 sec Box and Blocks:  Right 36 blocks, Left 36 blocks  SENSATION: Light touch: Impaired  R side is dull  EDEMA: NA  MUSCLE TONE: WFL  COGNITION: Overall cognitive status:  self reports short term memory issues  VISION: Subjective report: Feels like his vision is dim - during his flare up his vision has been blurry and with double vision (but has improved after flare up improved).  Baseline vision:  Wore glasses in elementary but not recently  - no eye dr since elementary Visual history: NA  VISION ASSESSMENT: TBD  Patient has difficulty with following activities due to following visual impairments: Eyes get blurry, has trouble with phone sometimes  PERCEPTION: Not tested  PRAXIS: Not tested  Evaluation OBSERVATIONS: Pt ambulates into therapy gym with no AE and no  loss of balance but movements are slow as is responses. The pt appears well kept and has hoody donned (even pulled over his head) at evaluation. Patient is observed watching himself move each block over the center divider for blocks and box test.   TODAY'S TREATMENT:  Therapeutic Activities Pt assisted to begin a Memory Binder with calendars printed for Sept/Oct and upcoming appts listed including MD appts (PCP & neuro) as well as therapy. Reviewed and listed ideas for pt to explore with MD re: difficulty with sleep habits. Current exercises placed in page protectors for patient to refer to with encouragement to add previous handouts etc.  Therapeutic Exercises  Patient provided several simple theraband Exercises.  Issued red band to start and demonstrated, instructed and sought return demonstration for the following (as listed in pt instructions also): - Standing Shoulder Horizontal Abduction with Resistance  - 1 x daily - 10 reps - Standing Shoulder Flexion with Self-Anchored Resistance  - 1 x daily - 10 reps - Standing Elbow Extension with Self-Anchored Resistance  - 1 x daily - 10 reps - Seated Elbow Flexion with Resistance  - 1 x daily - 10 reps  PATIENT EDUCATION: Education details: Theraband exercises; Memory binder  Person educated: Patient Education method: Explanation, Demonstration, Tactile cues, Verbal cues, and Handouts Education comprehension: verbalized understanding, returned demonstration, verbal cues required, tactile cues required, and needs further education  HOME EXERCISE PROGRAM:  07/29/23: Coordination with images; Putty Exercises Access Code: [email or web address] 08/12/23: Theraband Exercises Access Code: [email or web address]   GOALS: Goals reviewed with patient? Yes  SHORT TERM GOALS: Target date: 08/02/23  Patient will demonstrate UE strength(putty) and  coordination HEP with 25% verbal cues or less for proper execution. Baseline: New to OT. Goal status: IN Progress  2.  Patient will be aware of sleep positions to reduce stress to upper extremity nerves and improve comfort and ability to sleep to rate difficultly with sleep as Mild or better on QuickDash. Baseline: Self reports sensory changes that affect his sleep. - QuickDash - Moderate difficulty. Goal status: IN Progress  3.  Patient will demonstrate at least 65 lbs R & 55 lbs L grip strength over 3 trials as needed to open jars and other containers. Baseline: Progressively less strength over 3 trials Grip strengths: Right: 74.7, 65.0, 60.4 lbs; Left: 65.6, 53.7, 50.2  lbs Average: Right: 66.7 lbs Left: 56.5 lbs Goal status: IN Progress  4.  Patient will complete nine-hole peg with use of B UE in 30 seconds or less.  Baseline: 9 Hole Peg test: Right: 33.09  sec; Left: 32.27 sec Goal status: IN Progress  5.  Patient will demonstrate diplopia HEP with 25% verbal cues or less for proper execution. Baseline: reports of double vision with MS flares Goal status: INITIAL  6.  Patient will independently verbalize at least 3 energy conservation principles in relation to ADLs and daily routine to increase functional independence and decrease exhaustion.  Baseline: New MS diagnosis with decreased awareness of precautions. Goal status: INITIAL  LONG TERM GOALS: Target date: 08/30/23  Patient will demonstrate sensory HEP for re/desensitization in order to maximize sensory awareness and minimize sensory deficits. Baseline: Patient reports that he has numbness and tingling in his hands affecting sleep etc. Goal status: IN Progress  2.  Patient will demonstrate at least 70 lbs R & 60 lbs L grip strength over 3 trials as needed to carry groceries etc. Baseline: Progressively less strength over 3 trials Grip strengths: Right: 74.7, 65.0, 60.4 lbs; Left: 65.6, 53.7, 50.2  lbs Average: Right: 66.7  lbs Left: 56.5 lbs Goal status: IN Progress  3.  Pt will be able to place at least 40+ blocks using BUE hand with completion of Box and Blocks test without constant need for visual feedback. Baseline:  Box and Blocks:  Right 36 blocks, Left 36 blocks Goal status: IN Progress  4.  Pt will verbalize understanding of ways to prevent future MS related complications and MS community resources. Baseline: not yet initiated Goal status: IN Progress  5.  Pt will verbalize understanding of ways to keep thinking skills sharp and ways to compensate for current STM changes and issues in the future. Baseline: not yet initiated Goal status: IN Progress  6.  Patient will demonstrate at least 5% improvement with quick Dash score (reporting <20% disability or less) indicating improved functional use of affected extremity.   Baseline: Quick Dash: 22.7  Goal Status: INTIAL  ASSESSMENT:  CLINICAL IMPRESSION: Pt presents for occupational therapy treatment for recent diagnosis of MS and resulting impairments in strength, coordination and sensation. Pt responded well to interventions today for strengthening of BUEs with good initial variety of activities available for at home carryover.  Pt also provided memory binder for collecting handouts for exercises, resources etc.  Pt will benefit from ongoing skilled OT services to progress motor skills and provide resources as needed.  PERFORMANCE DEFICITS: in functional skills including ADLs, IADLs, coordination, dexterity, proprioception, sensation, strength, pain, Fine motor control, Gross motor control, mobility, balance, endurance, decreased knowledge of precautions, decreased knowledge of use of DME, vision, and UE functional use, cognitive skills including energy/drive and memory, and psychosocial skills including coping strategies, environmental adaptation, habits, and routines and behaviors.   IMPAIRMENTS: are limiting patient from ADLs, IADLs, rest and sleep,  work, leisure, and social participation.   CO-MORBIDITIES: may have co-morbidities  that affects occupational performance. Patient will benefit from skilled OT to address above impairments and improve overall function.  REHAB POTENTIAL: Good   PLAN:  OT FREQUENCY: 1-2x/week  OT DURATION: 6 weeks  PLANNED INTERVENTIONS: self care/ADL training, therapeutic exercise, therapeutic activity, neuromuscular re-education, aquatic therapy, patient/family education, cognitive remediation/compensation, visual/perceptual remediation/compensation, energy conservation, coping strategies training, and DME and/or AE instructions  RECOMMENDED OTHER SERVICES: Patient would benefit from eye exam and PCP follow up re: difficulty with sleep.  CONSULTED AND AGREED WITH PLAN OF CARE: Patient  PLAN FOR NEXT SESSIONs:   diplopia HEP/VISION assessment. Review HEPs - strength/putty, coordination  sensory comp handouts Education re: MS, resources, energy conservation Progress information binder - medical, HEP, resources, information    Victorino Sparrow, OT 08/12/2023, 4:52 PM

## 2023-08-14 ENCOUNTER — Ambulatory Visit: Payer: Medicaid Other | Admitting: Occupational Therapy

## 2023-08-14 DIAGNOSIS — M6281 Muscle weakness (generalized): Secondary | ICD-10-CM | POA: Diagnosis not present

## 2023-08-14 DIAGNOSIS — R278 Other lack of coordination: Secondary | ICD-10-CM

## 2023-08-14 DIAGNOSIS — R41842 Visuospatial deficit: Secondary | ICD-10-CM

## 2023-08-14 NOTE — Patient Instructions (Signed)
Diplopia HEP:  Perform at least 3 times per day. Stop if your eye becomes fatigued or hurts and try again later.  1. Hold a small object/card in front of you.  Hold it in the middle at arm's length away.    2. Cover your LEFT eye and look at the object with your RIGHT eye.  3. Slowly move the object side to side in front of you while continuing to watch it with your RIGHT eye.  4.  Remember to keep your head still and only move your eye.  5.  Repeat 5-10 times.  6.  Then, move object up and down while watching it 5-10 times.  7. Cover your RIGHT eye and look at the object with your LEFT eye while you repeat #1-6 above.  8.  Now, uncover both eyes and try to focus on the object while holding it in the middle.  Try to make it 1 image.   9.  If you can, try to hold it for 10-30 sec increasing as able.    10.  Once you can make the image 1 for at least 30 sec in the middle, repeat #1-6 above with both eyes moving slowly and only in the range that you can keep the image 1.  

## 2023-08-14 NOTE — Therapy (Unsigned)
OUTPATIENT OCCUPATIONAL THERAPY NEURO TREATMENT  Patient Name: Calvin Ward MRN: [id number] DOB:2000/12/18, 22 y.o., male Today's Date: 08/14/2023  PCP: Alberteen Sam, MD REFERRING PROVIDER: Alberteen Sam, MD  END OF SESSION:  OT End of Session - 08/14/23 1116     Visit Number 4    Number of Visits 12   + evaluation   Date for OT Re-Evaluation 08/30/23    Authorization Type [place] MEDICA[employer]    OT Start Time 1116    OT Stop Time 1200    OT Time Calculation (min) 44 min    Activity Tolerance Patient tolerated treatment well    Behavior During Therapy WFL for tasks assessed/performed             Past Medical History:  Diagnosis Date   Asthma    MS (multiple sclerosis) (HCC)    No past surgical history on file. Patient Active Problem List   Diagnosis Date Noted   Exacerbation of multiple sclerosis (HCC) 06/15/2023   Vitamin D deficiency 05/18/2023   Multiple sclerosis (HCC) 05/17/2023   Generalized abdominal pain 05/04/2019    ONSET DATE: 05/20/2023  REFERRING DIAG: G35 (ICD-10-CM) - Multiple sclerosis (HCC)  THERAPY DIAG:  Muscle weakness (generalized)  Visuospatial deficit  Other lack of coordination  Rationale for Evaluation and Treatment: Rehabilitation  SUBJECTIVE:   SUBJECTIVE STATEMENT:  Pt reports he is still having trouble falling asleep.  He had missed another appt and OTR contacted his mother who reports he needs afternoon appts.  Pt reports he takes his medicine at 10 PM or so before going to bed but doesn't fall asleep until about 3 AM and is not always able to stay asleep, although it's not related to how his hands are felling.  Pt reports using his putty a lot, especially when he's not doing anything at home.  Pt accompanied by: self   PERTINENT HISTORY:   Per hospital records (7/12 - 7/15) PMHx:  significant for asthma and recently diagnosed MS who had not yet started treatment presenting with worsening  neurologic symptoms including blurry vision left greater than right with diffuse extremity paresthesias.  Symptoms resolved/returned to baseline after 3 doses of IV solumedrol. Neurology has reevaluated the patient, recommending discharge home with plans to complete 5 days as an outpatient at the infusion center. He was reccommended to follow up with his neurologist, Dr. Epimenio Foot, after discharge with plans to start kesimpta.  PRECAUTIONS: None  WEIGHT BEARING RESTRICTIONS: No  PAIN:  Are you having pain?   No  FALLS: Has patient fallen in last 6 months? No had some stumbling between hospitalizations last month but no falls  LIVING ENVIRONMENT: Lives with: lives with their family - mother, 2 sisters and brother (patient is oldest of 4 - 2 siblings in HS and 1 in middle school) Lives in: House/apartment Stairs: No Has following equipment at home: None  PLOF: Independent, was driving but not much lately. It has been a year since he worked (at [employer])  PATIENT GOALS: Patient reported wanting to try and get my sensation back.    OBJECTIVE:   HAND DOMINANCE: Right  ADLs: Overall ADLs: Independent Tub-Shower transfers: Ind into tub/shower Equipment: none  IADLs: Shopping: Pt helps with shopping, he will take siblings with him,  Light housekeeping: Helps with laundry but finds the warm laundry room off the garage makes him feel exhausted Meal Prep: Mother completes Community mobility: Indep - no AE Medication management: Indep Financial management: NA - not working -  mother handles home finances Handwriting: During his flare up he couldn't really write but that has improved  MOBILITY STATUS: Independent  POSTURE COMMENTS:  No Significant postural limitations Sitting balance: WFL  ACTIVITY TOLERANCE: Activity tolerance: Low by self report - gets tired way faster even when he is not really doing anything  FUNCTIONAL OUTCOME MEASURES: Quick Dash: 22.7  - see image in eval PW  UPPER  EXTREMITY ROM:    Active ROM Right eval Left eval  Shoulder flexion WFL but self-reported his arms feeling weird in full extension overhead and out to the sides  Shoulder abduction   Shoulder adduction   Shoulder extension   Shoulder internal rotation   Shoulder external rotation   Elbow flexion   Elbow extension   Wrist flexion   Wrist extension   Wrist ulnar deviation   Wrist radial deviation   Wrist pronation   Wrist supination   (Blank rows = not tested)  UPPER EXTREMITY MMT:     MMT Right eval Left eval  Shoulder flexion 4+/5 4/5  Shoulder abduction    Shoulder adduction    Shoulder extension    Shoulder internal rotation    Shoulder external rotation    Middle trapezius    Lower trapezius    Elbow flexion    Elbow extension    Wrist flexion    Wrist extension    Wrist ulnar deviation    Wrist radial deviation    Wrist pronation    Wrist supination    (Blank rows = not tested)  HAND FUNCTION: Grip strength: Right: 74.7, 65.0, 60.4 lbs; Left: 65.6, 53.7, 50.2  lbs Average: Right: 66.7 lbs Left: 56.5 lbs  COORDINATION: 9 Hole Peg test: Right: 33.09  sec; Left: 32.27 sec Box and Blocks:  Right 36 blocks, Left 36 blocks  SENSATION: Light touch: Impaired  R side is dull  EDEMA: NA  MUSCLE TONE: WFL  COGNITION: Overall cognitive status:  self reports short term memory issues  VISION: Subjective report: Feels like his vision is dim - during his flare up his vision has been blurry and with double vision (but has improved after flare up improved).  Baseline vision:  Wore glasses in elementary but not recently  - no eye dr since elementary Visual history: NA  VISION ASSESSMENT: TBD  Patient has difficulty with following activities due to following visual impairments: Eyes get blurry, has trouble with phone sometimes  PERCEPTION: Not tested  PRAXIS: Not tested  Evaluation OBSERVATIONS: Pt ambulates into therapy gym with no AE and no loss of  balance but movements are slow as is responses. The pt appears well kept and has hoody donned (even pulled over his head) at evaluation. Patient is observed watching himself move each block over the center divider for blocks and box test.   TODAY'S TREATMENT:  Therapeutic Activities Pt assisted to begin a Memory Binder with calendars printed for Sept/Oct and upcoming appts listed including MD appts (PCP & neuro) as well as therapy. Reviewed and listed ideas for pt to explore with MD re: difficulty with sleep habits. Current exercises placed in page protectors for patient to refer to with encouragement to add previous handouts etc.  Therapeutic Exercises  Patient provided several simple theraband Exercises.  Issued red band to start and demonstrated, instructed and sought return demonstration for the following (as listed in pt instructions also): - Standing Shoulder Horizontal Abduction with Resistance  - 1 x daily - 10 reps - Standing Shoulder Flexion with Self-Anchored Resistance  - 1 x daily - 10 reps - Standing Elbow Extension with Self-Anchored Resistance  - 1 x daily - 10 reps - Seated Elbow Flexion with Resistance  - 1 x daily - 10 reps  PATIENT EDUCATION: Education details: Theraband exercises; Memory binder  Person educated: Patient Education method: Explanation, Demonstration, Tactile cues, Verbal cues, and Handouts Education comprehension: verbalized understanding, returned demonstration, verbal cues required, tactile cues required, and needs further education  HOME EXERCISE PROGRAM:  07/29/23: Coordination with images; Putty Exercises Access Code: [email or web address] 08/12/23: Theraband Exercises Access Code: [email or web address]   GOALS: Goals reviewed with patient? Yes  SHORT TERM GOALS: Target date: 08/02/23  Patient will demonstrate UE strength(putty) and coordination HEP  with 25% verbal cues or less for proper execution. Baseline: New to OT. Goal status: IN Progress  2.  Patient will be aware of sleep positions to reduce stress to upper extremity nerves and improve comfort and ability to sleep to rate difficultly with sleep as Mild or better on QuickDash. Baseline: Self reports sensory changes that affect his sleep. - QuickDash - Moderate difficulty. Goal status: IN Progress  3.  Patient will demonstrate at least 65 lbs R & 55 lbs L grip strength over 3 trials as needed to open jars and other containers. Baseline: Progressively less strength over 3 trials Grip strengths: Right: 74.7, 65.0, 60.4 lbs; Left: 65.6, 53.7, 50.2  lbs Average: Right: 66.7 lbs Left: 56.5 lbs Goal status: IN Progress  4.  Patient will complete nine-hole peg with use of B UE in 30 seconds or less.  Baseline: 9 Hole Peg test: Right: 33.09  sec; Left: 32.27 sec Goal status: IN Progress  5.  Patient will demonstrate diplopia HEP with 25% verbal cues or less for proper execution. Baseline: reports of double vision with MS flares Goal status: INITIAL  6.  Patient will independently verbalize at least 3 energy conservation principles in relation to ADLs and daily routine to increase functional independence and decrease exhaustion.  Baseline: New MS diagnosis with decreased awareness of precautions. Goal status: INITIAL  LONG TERM GOALS: Target date: 08/30/23  Patient will demonstrate sensory HEP for re/desensitization in order to maximize sensory awareness and minimize sensory deficits. Baseline: Patient reports that he has numbness and tingling in his hands affecting sleep etc. Goal status: IN Progress  2.  Patient will demonstrate at least 70 lbs R & 60 lbs L grip strength over 3 trials as needed to carry groceries etc. Baseline: Progressively less strength over 3 trials Grip strengths: Right: 74.7, 65.0, 60.4 lbs; Left: 65.6, 53.7, 50.2  lbs Average: Right: 66.7 lbs Left: 56.5  lbs Goal status: IN Progress  3.  Pt will be able to place at least 40+ blocks using BUE hand with completion of Box and Blocks test without constant need for visual feedback. Baseline:  Box and Blocks:  Right 36 blocks, Left 36 blocks Goal status: IN Progress  4.  Pt will verbalize understanding of ways to prevent future MS related complications and MS community resources. Baseline: not yet initiated Goal status: IN Progress  5.  Pt will verbalize understanding of ways to keep thinking skills sharp and ways to compensate for current STM changes and issues in the future. Baseline: not yet initiated Goal status: IN Progress  6.  Patient will demonstrate at least 5% improvement with quick Dash score (reporting <20% disability or less) indicating improved functional use of affected extremity.   Baseline: Quick Dash: 22.7  Goal Status: INTIAL  ASSESSMENT:  CLINICAL IMPRESSION: Pt presents for occupational therapy treatment for recent diagnosis of MS and resulting impairments in strength, coordination and sensation. Pt responded well to interventions today for strengthening of BUEs with good initial variety of activities available for at home carryover.  Pt also provided memory binder for collecting handouts for exercises, resources etc.  Pt will benefit from ongoing skilled OT services to progress motor skills and provide resources as needed.  PERFORMANCE DEFICITS: in functional skills including ADLs, IADLs, coordination, dexterity, proprioception, sensation, strength, pain, Fine motor control, Gross motor control, mobility, balance, endurance, decreased knowledge of precautions, decreased knowledge of use of DME, vision, and UE functional use, cognitive skills including energy/drive and memory, and psychosocial skills including coping strategies, environmental adaptation, habits, and routines and behaviors.   IMPAIRMENTS: are limiting patient from ADLs, IADLs, rest and sleep, work, leisure, and  social participation.   CO-MORBIDITIES: may have co-morbidities  that affects occupational performance. Patient will benefit from skilled OT to address above impairments and improve overall function.  REHAB POTENTIAL: Good   PLAN:  OT FREQUENCY: 1-2x/week  OT DURATION: 6 weeks  PLANNED INTERVENTIONS: self care/ADL training, therapeutic exercise, therapeutic activity, neuromuscular re-education, aquatic therapy, patient/family education, cognitive remediation/compensation, visual/perceptual remediation/compensation, energy conservation, coping strategies training, and DME and/or AE instructions  RECOMMENDED OTHER SERVICES: Patient would benefit from eye exam and PCP follow up re: difficulty with sleep.  CONSULTED AND AGREED WITH PLAN OF CARE: Patient  PLAN FOR NEXT SESSIONs:   diplopia HEP/VISION assessment. Review HEPs - strength/putty, coordination  sensory comp handouts Education re: MS, resources, energy conservation Progress information binder - medical, HEP, resources, information    Victorino Sparrow, OT 08/14/2023, 12:26 PM

## 2023-08-21 ENCOUNTER — Ambulatory Visit: Payer: Medicaid Other | Admitting: Occupational Therapy

## 2023-08-21 DIAGNOSIS — R41844 Frontal lobe and executive function deficit: Secondary | ICD-10-CM

## 2023-08-21 DIAGNOSIS — R278 Other lack of coordination: Secondary | ICD-10-CM

## 2023-08-21 DIAGNOSIS — M6281 Muscle weakness (generalized): Secondary | ICD-10-CM | POA: Diagnosis not present

## 2023-08-21 DIAGNOSIS — R208 Other disturbances of skin sensation: Secondary | ICD-10-CM

## 2023-08-21 NOTE — Patient Instructions (Addendum)
Memory Compensation Strategies  Use "WARM" strategy. W= write it down A=  associate it R=  repeat it M=  make a mental picture  You can keep a Memory Notebook. Use a 3-ring notebook with sections for the following:  calendar, important names and phone numbers, medications, doctors' names/phone numbers, "to do list"/reminders, and a section to journal what you did each day  Use a calendar to write appointments down.  Write yourself a schedule for the day.  This can be placed on the calendar or in a separate section of the Memory Notebook.  Keeping a regular schedule can help memory.  Use medication organizer with sections for each day or morning/evening pills  You may need help loading it  Keep a basket, or pegboard by the door.   Place items that you need to take out with you in the basket or on the pegboard.  You may also want to include a message board for reminders.  Use sticky notes. Place sticky notes with reminders in a place where the task is performed.  For example:  "turn off the stove" placed by the stove, "lock the door" placed on the door at eye level, "take your medications" on the bathroom mirror or by the place where you normally take your medications  Use alarms, timers, and/or a reminder app. Use while cooking to remind yourself to check on food or as a reminder to take your medicine, or as a reminder to make a call, or as a reminder to perform another task, etc.  Use a voice recorder app or small tape recorder to record important information and notes for yourself. Go back at the end of the day and listen to these.   Desensitization Techniques  The goal of desensitization and resensitization is to inhibit or interrupt the body's interpretation of routine stimuli as painful. It does not ensure that these stimuli will become pleasant or enjoyable or that your feeling will be completely normal, but that they will no longer provoke an extreme pain response, that you are  more sensory aware and that you are safe despite any residual deficits.    One way to desensitize a painful incision or tingly area OR to resensitize a numb area is by rubbing it with different textures. This will make your limb more tolerant to touch and pressure. Before you begin, make sure your hands and the materials you're using are clean.  To rub your painful/sensitive/numb/tingly area with different textures:  Sit in a comfortable position with the painful/sensitive/numb/tingly area uncovered. Start with a material that is soft, such as a cotton ball, kleenex, silk soft towel q-tips, sponge, or shaving cream,. Rub your painful/sensitive/tingly area in all directions. Start with a light pressure and gradually increase the pressure. Vary the textures you use as you are able to tolerate them. Start with soft materials like cotton balls or a makeup brush. Progress to materials that are rougher. Examples include a brush, rough towel, warm wash clothes, velcro, toothbrush, erasers, gloves, tennis ball, paint brush, wool, cotton balls, leather, rubber, etc. As you progress, gradually increase the pressure and roughness of the texture you use. Be careful not to rub over an incision if you still have staples or sutures in place, or if there are any open areas. Rub your limb for at least 30 seconds progressing up to a minute or two as often as you can tolerate, or as recommended by your healthcare provider.  Other methods for desensitization include: Brushing: use  a hair brush or combing in circular or sweeping motions over the area Tapping: use your hand to tap or pat the area Towel rubs: use a dry towel and rub the area in circular or sweeping motions Massage: massage the area with your hands, may use lotion Vibration: apply home massager or other vibration tool over area Light touch: gently "tickle" the area with your fingertips Allow cool or warm water to run over the area. Dipping your affected  area into a bowl of dry rice, sand, kidney beans, cold water or warm water. If you are able to dip your affected area into a medium such as rice, sand, or water - make sure to move the affected area around in the bowl until you cannot tolerate it or you reach one to two minutes, whichever comes first.  Use mental imagery and verbal/mental feedback ie) look at the object and tell yourself what the surface property/texture is while exploring the different objects/textures with affected digits.  Remember to apply regular stimulus to the affected area of the body for short durations, frequently throughout the day to increase sensory input to the brain ie)  use a combination of these methods for 10 to 15 minutes, 3-4 times per day.  Vary the objects you use, the difficulty of tasks (with and without vision) and try to incorporate sensory stimulation during different activities throughout the day/week. With time, the body will acclimate to the sensation, leading to a decrease in the body's pain response or hypersensitivity to the stimulus.

## 2023-08-21 NOTE — Therapy (Unsigned)
OUTPATIENT OCCUPATIONAL THERAPY NEURO TREATMENT  Patient Name: Aroldo Rokusek MRN: [id number] DOB:02/01/2001, 22 y.o., male Today's Date: 08/21/2023  PCP: Alberteen Sam, MD REFERRING PROVIDER: Alberteen Sam, MD  END OF SESSION:  OT End of Session - 08/21/23 1242     Visit Number 5    Number of Visits 12   + evaluation   Date for OT Re-Evaluation 08/30/23    Authorization Type [place] MEDICAID [employer]    OT Start Time 1240    OT Stop Time 1315    OT Time Calculation (min) 35 min    Activity Tolerance Patient tolerated treatment well    Behavior During Therapy WFL for tasks assessed/performed             Past Medical History:  Diagnosis Date   Asthma    MS (multiple sclerosis) (HCC)    No past surgical history on file. Patient Active Problem List   Diagnosis Date Noted   Exacerbation of multiple sclerosis (HCC) 06/15/2023   Vitamin D deficiency 05/18/2023   Multiple sclerosis (HCC) 05/17/2023   Generalized abdominal pain 05/04/2019    ONSET DATE: 05/20/2023  REFERRING DIAG: G35 (ICD-10-CM) - Multiple sclerosis (HCC)  THERAPY DIAG:  Muscle weakness (generalized)  Other lack of coordination  Other disturbances of skin sensation  Frontal lobe and executive function deficit  Rationale for Evaluation and Treatment: Rehabilitation  SUBJECTIVE:   SUBJECTIVE STATEMENT:  Pt reports has been doing his stretches but forgot about his vision exercises.  Pt accompanied by: self   PERTINENT HISTORY:   Per hospital records (7/12 - 7/15) PMHx:  significant for asthma and recently diagnosed MS who had not yet started treatment presenting with worsening neurologic symptoms including blurry vision left greater than right with diffuse extremity paresthesias.  Symptoms resolved/returned to baseline after 3 doses of IV solumedrol. Neurology has reevaluated the patient, recommending discharge home with plans to complete 5 days as an outpatient at the infusion  center. He was reccommended to follow up with his neurologist, Dr. Epimenio Foot, after discharge with plans to start kesimpta.  PRECAUTIONS: None  WEIGHT BEARING RESTRICTIONS: No  PAIN:  Are you having pain?   No  FALLS: Has patient fallen in last 6 months? No had some stumbling between hospitalizations last month but no falls  LIVING ENVIRONMENT: Lives with: lives with their family - mother, 2 sisters and brother (patient is oldest of 4 - 2 siblings in HS and 1 in middle school) Lives in: House/apartment Stairs: No Has following equipment at home: None  PLOF: Independent, was driving but not much lately. It has been a year since he worked (at [employer])  PATIENT GOALS: Patient reported wanting to try and get my sensation back.    OBJECTIVE:   HAND DOMINANCE: Right  ADLs: Overall ADLs: Independent Tub-Shower transfers: Ind into tub/shower Equipment: none  IADLs: Shopping: Pt helps with shopping, he will take siblings with him,  Light housekeeping: Helps with laundry but finds the warm laundry room off the garage makes him feel exhausted Meal Prep: Mother completes Community mobility: Indep - no AE Medication management: Indep Financial management: NA - not working - mother handles home finances Handwriting: During his flare up he couldn't really write but that has improved  MOBILITY STATUS: Independent  POSTURE COMMENTS:  No Significant postural limitations Sitting balance: WFL  ACTIVITY TOLERANCE: Activity tolerance: Low by self report - gets tired way faster even when he is not really doing anything  FUNCTIONAL OUTCOME MEASURES: Quick Dash:  22.7  - see image in eval PW  UPPER EXTREMITY ROM:    Active ROM Right eval Left eval  Shoulder flexion WFL but self-reported his arms feeling weird in full extension overhead and out to the sides  Shoulder abduction   Shoulder adduction   Shoulder extension   Shoulder internal rotation   Shoulder external rotation   Elbow  flexion   Elbow extension   Wrist flexion   Wrist extension   Wrist ulnar deviation   Wrist radial deviation   Wrist pronation   Wrist supination   (Blank rows = not tested)  UPPER EXTREMITY MMT:     MMT Right eval Left eval  Shoulder flexion 4+/5 4/5  Shoulder abduction    Shoulder adduction    Shoulder extension    Shoulder internal rotation    Shoulder external rotation    Middle trapezius    Lower trapezius    Elbow flexion    Elbow extension    Wrist flexion    Wrist extension    Wrist ulnar deviation    Wrist radial deviation    Wrist pronation    Wrist supination    (Blank rows = not tested)  HAND FUNCTION: Grip strength: Right: 74.7, 65.0, 60.4 lbs; Left: 65.6, 53.7, 50.2  lbs Average: Right: 66.7 lbs Left: 56.5 lbs  COORDINATION: 9 Hole Peg test: Right: 33.09  sec; Left: 32.27 sec Box and Blocks:  Right 36 blocks, Left 36 blocks  SENSATION: Light touch: Impaired  R side is dull  EDEMA: NA  MUSCLE TONE: WFL  COGNITION: Overall cognitive status:  self reports short term memory issues  VISION: Subjective report: Feels like his vision is dim - during his flare up his vision has been blurry and with double vision (but has improved after flare up improved).  Baseline vision:  Wore glasses in elementary but not recently  - no eye dr since elementary Visual history: NA  VISION ASSESSMENT: TBD  Patient has difficulty with following activities due to following visual impairments: Eyes get blurry, has trouble with phone sometimes  PERCEPTION: Not tested  PRAXIS: Not tested  Evaluation OBSERVATIONS: Pt ambulates into therapy gym with no AE and no loss of balance but movements are slow as is responses. The pt appears well kept and has hoody donned (even pulled over his head) at evaluation. Patient is observed watching himself move each block over the center divider for blocks and box test.   TODAY'S TREATMENT:                                                                                                                                Therapeutic Activities: Introduced Ecologist Strategies  Use "WARM" strategy. W= write it down A=  associate it R=  repeat it M=  make a mental picture  Introduced desensitization strategies  PATIENT EDUCATION: Education details: memory Strategies and desensitization ideas Person educated: Patient Education method: Explanation,  Demonstration, Tactile cues, Verbal cues, and Handouts Education comprehension: verbalized understanding, returned demonstration, verbal cues required, tactile cues required, and needs further education  HOME EXERCISE PROGRAM:  07/29/23: Coordination with images; Putty Exercises Access Code: [email or web address] 08/12/23: Theraband Exercises Access Code: [email or web address] 08/14/23: Diplopia HEP and Energy Conservation 08/21/23: Memory strategies and desensitization strategies   GOALS: Goals reviewed with patient? Yes  SHORT TERM GOALS: Target date: 08/02/23  Patient will demonstrate UE strength(putty) and coordination HEP with 25% verbal cues or less for proper execution. Baseline: New to OT. Goal status: IN Progress  2.  Patient will be aware of sleep positions to reduce stress to upper extremity nerves and improve comfort and ability to sleep to rate difficultly with sleep as Mild or better on QuickDash. Baseline: Self reports sensory changes that affect his sleep. - QuickDash - Moderate difficulty. Goal status: IN Progress  3.  Patient will demonstrate at least 65 lbs R & 55 lbs L grip strength over 3 trials as needed to open jars and other containers. Baseline: Progressively less strength over 3 trials Grip strengths: Right: 74.7, 65.0, 60.4 lbs; Left: 65.6, 53.7, 50.2  lbs Average: Right: 66.7 lbs Left: 56.5 lbs Goal status: IN Progress  4.  Patient will complete nine-hole peg with use of B UE in 30 seconds or less.  Baseline: 9 Hole Peg test: Right: 33.09  sec; Left: 32.27  sec Goal status: IN Progress  5.  Patient will demonstrate diplopia HEP with 25% verbal cues or less for proper execution. Baseline: reports of double vision with MS flares Goal status: IN Progress  6.  Patient will independently verbalize at least 3 energy conservation principles in relation to ADLs and daily routine to increase functional independence and decrease exhaustion.  Baseline: New MS diagnosis with decreased awareness of precautions. Goal status: IN Progress  LONG TERM GOALS: Target date: 08/30/23  Patient will demonstrate sensory HEP for re/desensitization in order to maximize sensory awareness and minimize sensory deficits. Baseline: Patient reports that he has numbness and tingling in his hands affecting sleep etc. Goal status: IN Progress  2.  Patient will demonstrate at least 70 lbs R & 60 lbs L grip strength over 3 trials as needed to carry groceries etc. Baseline: Progressively less strength over 3 trials Grip strengths: Right: 74.7, 65.0, 60.4 lbs; Left: 65.6, 53.7, 50.2  lbs Average: Right: 66.7 lbs Left: 56.5 lbs Goal status: IN Progress  3.  Pt will be able to place at least 40+ blocks using BUE hand with completion of Box and Blocks test without constant need for visual feedback. Baseline: Box and Blocks:  Right 36 blocks, Left 36 blocks Goal status: IN Progress  4.  Pt will verbalize understanding of ways to prevent future MS related complications and MS community resources. Baseline: not yet initiated Goal status: IN Progress  5.  Pt will verbalize understanding of ways to keep thinking skills sharp and ways to compensate for current STM changes and issues in the future. Baseline: not yet initiated Goal status: IN Progress  6.  Patient will demonstrate at least 5% improvement with quick Dash score (reporting <20% disability or less) indicating improved functional use of affected extremity.   Baseline: Quick Dash: 22.7  Goal Status:  INTIAL  ASSESSMENT:  CLINICAL IMPRESSION: Pt returned for occupational therapy treatment for recent diagnosis of MS and resulting impairments in strength, coordination and sensation. Pt responded well to interventions today for Diplopia HEP and initial energy conservation education with information provided in  an easy formate to keep in his memory binder.  Pt will benefit from ongoing skilled OT services to progress motor skills and provide resources as needed.  PERFORMANCE DEFICITS: in functional skills including ADLs, IADLs, coordination, dexterity, proprioception, sensation, strength, pain, Fine motor control, Gross motor control, mobility, balance, endurance, decreased knowledge of precautions, decreased knowledge of use of DME, vision, and UE functional use, cognitive skills including energy/drive and memory, and psychosocial skills including coping strategies, environmental adaptation, habits, and routines and behaviors.   IMPAIRMENTS: are limiting patient from ADLs, IADLs, rest and sleep, work, leisure, and social participation.   CO-MORBIDITIES: may have co-morbidities  that affects occupational performance. Patient will benefit from skilled OT to address above impairments and improve overall function.  REHAB POTENTIAL: Good   PLAN:  OT FREQUENCY: 1-2x/week  OT DURATION: 6 weeks  PLANNED INTERVENTIONS: self care/ADL training, therapeutic exercise, therapeutic activity, neuromuscular re-education, aquatic therapy, patient/family education, cognitive remediation/compensation, visual/perceptual remediation/compensation, energy conservation, coping strategies training, and DME and/or AE instructions  RECOMMENDED OTHER SERVICES: Patient would benefit from eye exam and PCP follow up re: difficulty with sleep.  CONSULTED AND AGREED WITH PLAN OF CARE: Patient  PLAN FOR NEXT SESSIONs:   Check diplopia HEP/VISION assessment. Review/progress HEPs - strength/putty, Engineer, building services  comp handouts Education re: MS, resources, energy conservation Memory activities HEP   Victorino Sparrow, OT 08/21/2023, 5:26 PM

## 2023-08-27 ENCOUNTER — Ambulatory Visit: Payer: Medicaid Other | Admitting: Occupational Therapy

## 2023-09-04 ENCOUNTER — Encounter (INDEPENDENT_AMBULATORY_CARE_PROVIDER_SITE_OTHER): Payer: Self-pay | Admitting: Primary Care

## 2023-09-04 ENCOUNTER — Ambulatory Visit (INDEPENDENT_AMBULATORY_CARE_PROVIDER_SITE_OTHER): Payer: Medicaid Other | Admitting: Primary Care

## 2023-09-04 VITALS — BP 134/88 | HR 89 | Resp 16 | Wt 148.2 lb

## 2023-09-04 DIAGNOSIS — M79601 Pain in right arm: Secondary | ICD-10-CM

## 2023-09-04 DIAGNOSIS — G35 Multiple sclerosis: Secondary | ICD-10-CM | POA: Diagnosis not present

## 2023-09-04 DIAGNOSIS — Z2821 Immunization not carried out because of patient refusal: Secondary | ICD-10-CM

## 2023-09-04 NOTE — Progress Notes (Signed)
  [facility]  Calvin Ward, is a 22 y.o. male  AVW:[id number]  WGN:[id number]  DOB - 2001-02-02 Left arm pain      Subjective:   Calvin Ward is a 22 y.o. male here today for an acute visit pain in his left arm making difficult to sleep at night and normally around 1am or bed time . Already has issues with insomnia. Explain will defer treatment to neurologist who is tx for MS. Patient stated medication helped in the beginning but now not as effective.  . Patient has No headache, No chest pain, No abdominal pain - No Nausea, No new weakness tingling or numbness, No Cough - shortness of breath  No problems updated.  Allergies  Allergen Reactions   Shellfish Allergy     Past Medical History:  Diagnosis Date   Asthma    MS (multiple sclerosis) (HCC)     Current Outpatient Medications on File Prior to Visit  Medication Sig Dispense Refill   KESIMPTA 20 MG/0.4ML SOAJ Inject into the skin.     traZODone (DESYREL) 50 MG tablet Take 1 tablet (50 mg total) by mouth at bedtime. 30 tablet 3   No current facility-administered medications on file prior to visit.    Objective:   Vitals:   09/04/23 1426  BP: 134/88  Pulse: 89  Resp: 16  SpO2: 99%  Weight: 148 lb 3.2 oz (67.2 kg)    Comprehensive ROS Pertinent positive and negative noted in HPI   Exam General appearance : Awake, alert, not in any distress. Speech Clear. Not toxic looking HEENT: Atraumatic and Normocephalic, pupils equally reactive to light and accomodation Neck: Supple, no JVD. No cervical lymphadenopathy.  Chest: Good air entry bilaterally, no added sounds  CVS: S1 S2 regular, no murmurs.  Abdomen: Bowel sounds present, Non tender and not distended with no gaurding, rigidity or rebound. Extremities: B/L Lower Ext shows no edema, both legs are warm to touch Neurology: Awake alert, and oriented X 3, CN II-XII intact, Non focal Skin: No Rash  Data Review No results found for:  "HGBA1C"  Assessment & Plan  Diagnoses and all orders for this visit:  Influenza vaccination declined  Multiple sclerosis (HCC) 2/2 Right arm pain See HPI     Patient have been counseled extensively about nutrition and exercise. Other issues discussed during this visit include: low cholesterol diet, weight control and daily exercise, foot care, annual eye examinations at Ophthalmology, importance of adherence with medications and regular follow-up. We also discussed long term complications of uncontrolled diabetes and hypertension.   No follow-ups on file.  The patient was given clear instructions to go to ER or return to medical center if symptoms don't improve, worsen or new problems develop. The patient verbalized understanding. The patient was told to call to get lab results if they haven't heard anything in the next week.   This note has been created with Education officer, environmental. Any transcriptional errors are unintentional.   Grayce Sessions, NP 09/04/2023, 6:35 PM

## 2023-09-04 NOTE — Patient Instructions (Signed)
Preventing Hypertension Hypertension, also called high blood pressure, is when the force of blood pumping through the arteries is too strong. Arteries are blood vessels that carry blood from the heart throughout the body. Often, hypertension does not cause symptoms until blood pressure is very high. It is important to have your blood pressure checked regularly. Diet and lifestyle changes can help you prevent hypertension, and they may make you feel better overall and improve your quality of life. If you already have hypertension, you may control it with diet and lifestyle changes, as well as with medicine. How can this condition affect me? Over time, hypertension can damage the arteries and decrease blood flow to important parts of the body, including the brain, heart, and kidneys. By keeping your blood pressure in a healthy range, you can help prevent complications like heart attack, heart failure, stroke, kidney failure, and vascular dementia. What can increase my risk? An unhealthy diet and a lack of physical activity can make you more likely to develop high blood pressure. Some other risk factors include: Age. The risk increases with age. Having family members who have had high blood pressure. Having certain health conditions, such as thyroid problems. Being overweight or obese. Drinking too much alcohol or caffeine. Having too much fat, sugar, calories, or salt (sodium) in your diet. Smoking or using illegal drugs. Taking certain medicines, such as antidepressants, decongestants, birth control pills, and NSAIDs, such as ibuprofen. What actions can I take to prevent or manage this condition? Work with your health care provider to make a hypertension prevention plan that works for you. You may be referred for counseling on a healthy diet and physical activity. Follow your plan and keep all follow-up visits. Diet changes Maintain a healthy diet. This includes: Eating less salt (sodium). Ask your  health care provider how much sodium is safe for you to have. The general recommendation is to have less than 1 tsp (2,300 mg) of sodium a day. Do not add salt to your food. Choose low-sodium options when grocery shopping and eating out. Limiting fats in your diet. You can do this by eating low-fat or fat-free dairy products and by eating less red meat. Eating more fruits, vegetables, and whole grains. Make a goal to eat: 1-2 cups of fresh fruits and vegetables each day. 3-4 servings of whole grains each day. Avoiding foods and beverages that have added sugars. Eating fish that contain healthy fats (omega-3 fatty acids), such as mackerel or salmon. If you need help putting together a healthy eating plan, try the DASH diet. This diet is high in fruits, vegetables, and whole grains. It is low in sodium, red meat, and added sugars. DASH stands for Dietary Approaches to Stop Hypertension. Lifestyle changes  Lose weight if you are overweight. Losing just 3-5% of your body weight can help prevent or control hypertension. For example, if your present weight is 200 lb (91 kg), a loss of 3-5% of your weight means losing 6-10 lb (2.7-4.5 kg). Ask your health care provider to help you with a diet and exercise plan to safely lose weight. Get enough exercise. Do at least 150 minutes of moderate-intensity exercise each week. You could do this in short exercise sessions several times a day, or you could do longer exercise sessions a few times a week. For example, you could take a brisk 10-minute walk or bike ride, 3 times a day, for 5 days a week. Find ways to reduce stress, such as exercising, meditating, listening to   music, or taking a yoga class. If you need help reducing stress, ask your health care provider. Do not use any products that contain nicotine or tobacco. These products include cigarettes, chewing tobacco, and vaping devices, such as e-cigarettes. Chemicals in tobacco and nicotine products raise your  blood pressure each time you use them. If you need help quitting, ask your health care provider. Learn how to check your blood pressure at home. Make sure that you know your personal target blood pressure, as told by your health care provider. Try to sleep 7-9 hours per night. Alcohol use Do not drink alcohol if: Your health care provider tells you not to drink. You are pregnant, may be pregnant, or are planning to become pregnant. If you drink alcohol: Limit how much you have to: 0-1 drink a day for women. 0-2 drinks a day for men. Know how much alcohol is in your drink. In the [place] one drink equals one 12 oz bottle of beer (355 mL), one 5 oz glass of wine (148 mL), or one 1 oz glass of hard liquor (44 mL). Medicines In addition to diet and lifestyle changes, your health care provider may recommend medicines to help lower your blood pressure. In general: You may need to try a few different medicines to find what works best for you. You may need to take more than one medicine. Take over-the-counter and prescription medicines only as told by your health care provider. Questions to ask your health care provider What is my blood pressure goal? How can I lower my risk for high blood pressure? How should I monitor my blood pressure at home? Where to find support Your health care provider can help you prevent hypertension and help you keep your blood pressure at a healthy level. Your local hospital or your community may also provide support services and prevention programs. The American Heart Association offers an online support network at supportnetwork.heart.org Where to find more information Learn more about hypertension from: National Heart, Lung, and Blood Institute: www.nhlbi.nih.gov Centers for Disease Control and Prevention: www.cdc.gov American Academy of Family Physicians: familydoctor.org Learn more about the DASH diet from: National Heart, Lung, and Blood Institute:  www.nhlbi.nih.gov Contact a health care provider if: You think you are having a reaction to medicines you have taken. You have recurrent headaches or feel dizzy. You have swelling in your ankles. You have trouble with your vision. Get help right away if: You have sudden, severe chest, back, or abdominal pain or discomfort. You have shortness of breath. You have a sudden, severe headache. These symptoms may be an emergency. Get help right away. Call 911. Do not wait to see if the symptoms will go away. Do not drive yourself to the hospital. Summary Hypertension often does not cause any symptoms until blood pressure is very high. It is important to get your blood pressure checked regularly. Diet and lifestyle changes are important steps in preventing hypertension. By keeping your blood pressure in a healthy range, you may prevent complications like heart attack, heart failure, stroke, and kidney failure. Work with your health care provider to make a hypertension prevention plan that works for you. This information is not intended to replace advice given to you by your health care provider. Make sure you discuss any questions you have with your health care provider. Document Revised: 09/07/2021 Document Reviewed: 09/07/2021 Elsevier Patient Education  2024 [employer]vier Inc.  

## 2023-09-18 NOTE — Addendum Note (Signed)
Addended by: Ihor Austin D on: 09/18/2023 10:42 AM   Modules accepted: Orders

## 2023-10-02 ENCOUNTER — Ambulatory Visit: Payer: Medicaid Other | Admitting: Neurology

## 2023-10-02 ENCOUNTER — Encounter: Payer: Self-pay | Admitting: Neurology

## 2023-10-02 VITALS — BP 126/75 | HR 84 | Ht 68.0 in | Wt 144.5 lb

## 2023-10-02 DIAGNOSIS — G35 Multiple sclerosis: Secondary | ICD-10-CM | POA: Diagnosis not present

## 2023-10-02 DIAGNOSIS — R269 Unspecified abnormalities of gait and mobility: Secondary | ICD-10-CM

## 2023-10-02 DIAGNOSIS — H539 Unspecified visual disturbance: Secondary | ICD-10-CM | POA: Diagnosis not present

## 2023-10-02 DIAGNOSIS — R200 Anesthesia of skin: Secondary | ICD-10-CM

## 2023-10-02 DIAGNOSIS — R208 Other disturbances of skin sensation: Secondary | ICD-10-CM

## 2023-10-02 MED ORDER — GABAPENTIN 300 MG PO CAPS: ORAL_CAPSULE | ORAL | 5 refills | Status: AC

## 2023-10-02 NOTE — Progress Notes (Signed)
[facility]  PATIENT: Calvin Ward DOB: Apr 10, 2001  REFERRING DOCTOR OR PCP: Calvin Sans, NP SOURCE: Patient, extensive notes from hospitalization plan 2024) and emergency room visit 2022, imaging and lab reports, multiple MRI images personally reviewed and reviewed again with Calvin patient  _________________________________   HISTORICAL  CHIEF COMPLAINT:  Chief Complaint  Patient presents with   Follow-up    Pt in room 10 alone. Here for MS follow up on Kesimpta. Pt states he is still having trouble sleeping even with taking trazodone, c/o muscle pain in arms.     HISTORY OF PRESENT ILLNESS:  Calvin Ward is a 22 y.o. man with relapsing remitting MS  Update 10/02/2023 He started Kesimpta after Calvin last visit and has tolerated it well  He feels gait is doing well.  No issues with balance or stairs.    Legs are strong.   He notes left arm numbness and milder left leg numbness.Marland Kitchen   He denies numbness in his limbs now.  He still notes mild visual blurring but it has improved.   His urinary urgency is better but he still has nocturia.     Cognitively, he has some word finding issues.   He has felt some depression with Calvin diagnosis and he had a couple crying spells.   He has had some mood swings and is more irritable.  This is just slightly better.  He feels more tired.   He has sleep onset insomnia >  Sleep maintenance.   Trazodone had not helped much.    He wakes up 1-2 times for nocturia and a couple times when he just wakes up.      MS History: Calvin Ward is a 22 year old man who presented to Calvin emergency room on 05/17/2023 with a 2-week history of right-sided weakness, numbness and blurred vision (unclear if monocular or binocular). These symptoms developed overnight as he woke up with this in early June.   Calvin visual symptoms improved by Calvin time he went to Calvin emergency room Calvin other symptoms persisted.  Imaging studies were consistent with multiple  sclerosis and a large focus in Calvin left hemisphere enhanced after contrast with a tumefactive appearance.  He was admitted and received 5 days IV Solu-Medrol/high-dose prednisone.  Right sided symptoms were unchanged at discharge but better Calvin next week  In retrospect, in July 2022, he presented with left body numbness lasting a couple weeks that began with a left postauricular headache.  At Calvin time he also had some weakness and pain of Calvin left hand.  Imaging studies were read as normal though I see a couple foci in Calvin brain and at Calvin cervicomedullary junction at that time.   He was working at Calvin time at [employer] and took a few days.     Imaging: MRI of Calvin head 05/17/2023 shows T2 hyperintense foci at Calvin cervicomedullary junction to Calvin left, left cerebellar hemisphere, in Calvin posterior limbs of Calvin internal capsules and in Calvin periventricular, juxtacortical and deep white matter of both cerebral hemispheres.  A large focus in Calvin posterior left frontal lobe enhances after contrast.  MRI of Calvin cervical spine 05/17/2023 shows a focus at Calvin cervicomedullary junction towards Calvin left (sagittal views only) and within Calvin spinal cord posteriorly adjacent to C2-C3 and possibly to Calvin left adjacent to C6-C7 (seen only on Calvin axial gradient echo images).  No significant degenerative change.  Normal enhancement pattern  MRI scan of Calvin head 06/17/2021 showed a small T2/FLAIR  hyperintense focus in Calvin periventricular white matter of Calvin right temporal-occipital lobe, right middle cerebellar peduncle/cerebellum and a subtle focus to Calvin left at Calvin cervicomedullary junction.  It was otherwise normal.  MRI of Calvin cervical spine 06/17/2021 showed Calvin focus at Calvin cervicomedullary junction towards Calvin left    Laboratory: June 2024: HIV varicella-zoster IgG, acute hepatitis panel.  TB test CBC with differential and P[place]ock function panel normal, negative or noncontributory  REVIEW OF SYSTEMS: Constitutional: No  fevers, chills, sweats, or change in appetite Eyes: No visual changes, double vision, eye pain Ear, nose and throat: No hearing loss, ear pain, nasal congestion, sore throat Cardiovascular: No chest pain, palpitations Respiratory:  No shortness of breath at rest or with exertion.   No wheezes GastrointestinaI: No nausea, vomiting, diarrhea, abdominal pain, fecal incontinence Genitourinary:  No dysuria, urinary retention or frequency.  No nocturia. Musculoskeletal:  No neck pain, back pain Integumentary: No rash, pruritus, skin lesions Neurological: as above Psychiatric: No depression at this time.  No anxiety Endocrine: No palpitations, diaphoresis, change in appetite, change in weigh or increased thirst Hematologic/Lymphatic:  No anemia, purpura, petechiae. Allergic/Immunologic: No itchy/runny eyes, nasal congestion, recent allergic reactions, rashes  ALLERGIES: Allergies  Allergen Reactions   Shellfish Allergy     HOME MEDICATIONS:  Current Outpatient Medications:    gabapentin (NEURONTIN) 300 MG capsule, One po qAM, one po qPM and two po qHS, Disp: 120 capsule, Rfl: 5   KESIMPTA 20 MG/0.4ML SOAJ, Inject into Calvin skin., Disp: , Rfl:    traZODone (DESYREL) 50 MG tablet, Take 1 tablet (50 mg total) by mouth at bedtime., Disp: 30 tablet, Rfl: 3  PAST MEDICAL HISTORY: Past Medical History:  Diagnosis Date   Asthma    MS (multiple sclerosis) (HCC)     PAST SURGICAL HISTORY: History reviewed. No pertinent surgical history.  FAMILY HISTORY: Family History  Problem Relation Age of Onset   Multiple sclerosis Neg Hx     SOCIAL HISTORY: Social History   Socioeconomic History   Marital status: Single    Spouse name: Not on file   Number of children: Not on file   Years of education: Not on file   Highest education level: Not on file  Occupational History   Not on file  Tobacco Use   Smoking status: Never   Smokeless tobacco: Never  Substance and Sexual Activity    Alcohol use: Never   Drug use: Never   Sexual activity: Not on file  Other Topics Concern   Not on file  Social History Narrative   Right Handed   Social Determinants of Health   Financial Resource Strain: Not on file  Food Insecurity: No Food Insecurity (06/15/2023)   Hunger Vital Sign    Worried About Running Out of Food in Calvin Last Year: Never true    Ran Out of Food in Calvin Last Year: Never true  Transportation Needs: No Transportation Needs (06/15/2023)   PRAPARE - Administrator, Civil Service (Medical): No    Lack of Transportation (Non-Medical): No  Physical Activity: Not on file  Stress: Not on file  Social Connections: Not on file  Intimate Partner Violence: Not At Risk (06/15/2023)   Humiliation, Afraid, Rape, and Kick questionnaire    Fear of Current or Ex-Partner: No    Emotionally Abused: No    Physically Abused: No    Sexually Abused: No       PHYSICAL EXAM  Vitals:   10/02/23 1441  BP: 126/75  Pulse: 84  Weight: 144 lb 8 oz (65.5 kg)  Height: 5\' 8"  (1.727 m)    Body mass index is 21.97 kg/m.   General: Calvin patient is well-developed and well-nourished and in no acute distress  HEENT:  Head is [place]/AT.  Sclera are anicteric.     Skin: Extremities are without rash or  edema.  Musculoskeletal:  Back is nontender  Neurologic Exam  Mental status: Calvin patient is alert and oriented x 3 at Calvin time of Calvin examination. Calvin patient has apparent normal recent and remote memory, with an apparently normal attention span and concentration ability.   Speech is normal.  Cranial nerves: Extraocular movements are full. Pupils are equal, round, and reactive to light and accomodation. .Facial strength is normal.  Trapezius and sternocleidomastoid strength is normal. No dysarthria is noted.  Calvin tongue is midline, and Calvin patient has symmetric elevation of Calvin soft palate. No obvious hearing deficits are noted.  Motor:  Muscle bulk is normal.   Tone is  slightly increased in Calvin left arm relative to Calvin right. Strength is  5 / 5 in all 4 extremities.   Sensory: Sensory testing is intact to pinprick, soft touch sensation on Calvin right but he has reduced touch, temperature and vibration sensation in Calvin left  arm more evident than Calvin leg.   He reported more symmetric vibration  Coordination: Cerebellar testing reveals good finger-nose-finger and heel-to-shin bilaterally.  Gait and station: Station is normal.   Gait is near normal but Calvin tandem gait is wide. Romberg is negative.   Reflexes: Deep tendon reflexes are symmetric and normal in Calvin arms.  There is spread at Calvin knees (3+).  No ankle clonus..   Plantar responses are flexor.    DIAGNOSTIC DATA (LABS, IMAGING, TESTING) - I reviewed patient records, labs, notes, testing and imaging myself where available.  Lab Results  Component Value Date   WBC 10.7 (H) 06/16/2023   HGB 13.7 06/16/2023   HCT 37.2 (L) 06/16/2023   MCV 84.4 06/16/2023   PLT 439 (H) 06/16/2023      Component Value Date/Time   NA 134 (L) 06/16/2023 0210   K 3.6 06/16/2023 0210   CL 102 06/16/2023 0210   CO2 24 06/16/2023 0210   GLUCOSE 125 (H) 06/16/2023 0210   BUN 9 06/16/2023 0210   CREATININE 0.88 06/16/2023 0210   CALCIUM 9.2 06/16/2023 0210   PROT 7.9 06/14/2023 1851   ALBUMIN 4.6 06/14/2023 1851   AST 19 06/14/2023 1851   ALT 25 06/14/2023 1851   ALKPHOS 67 06/14/2023 1851   BILITOT 1.5 (H) 06/14/2023 1851   GFRNONAA >60 06/16/2023 0210       ASSESSMENT AND PLAN  Multiple sclerosis (HCC)  Gait disturbance  Numbness  Vision disturbance  Dysesthesia   Continue Kesimpta.  He has just started so we will hold off on blood work but check some at Calvin next visit. He continues to report uncomfortable sensations in Calvin left arm.  I will have him try gabapentin to see if this is beneficial.  This may also help his sleep With active and exercise as tolerated. Return in 6 months or sooner if  there are new or worsening neurologic symptoms   Rozalyn Osland A. Epimenio Foot, MD, [facility] 10/02/2023, 3:08 PM Certified in Neurology, Clinical Neurophysiology, Sleep Medicine and Neuroimaging  [facility] [address] [place], [place] [address] [phone]

## 2023-10-22 ENCOUNTER — Telehealth: Payer: Self-pay | Admitting: *Deleted

## 2023-10-22 DIAGNOSIS — Z0289 Encounter for other administrative examinations: Secondary | ICD-10-CM

## 2023-10-22 NOTE — Telephone Encounter (Signed)
Called mother to speak about FMLA form. She is looking for 3-4 days per month to help him to and from appt/help with basic needs. This will help protect her job if she has to be out to help him. Aware I will give to Dr. Epimenio Foot to review. Once complete, I will give to medical records to process for her. She verbalized understanding.

## 2023-10-22 NOTE — Telephone Encounter (Signed)
Pt fmla form ready for p/u. Call unable to leave a message

## 2023-10-22 NOTE — Telephone Encounter (Signed)
Gave completed/signed form back to medical records to process for pt. 

## 2024-01-16 ENCOUNTER — Encounter: Payer: Self-pay | Admitting: Occupational Therapy

## 2024-01-16 NOTE — Therapy (Signed)
[facility] [address] [place], [place], [address] Phone: [phone]   Fax:  [phone]  Patient Details  Name: Calvin Ward MRN: [id number] Date of Birth: Sep 07, 2001 Referring Provider:  No ref. provider found  OCCUPATIONAL THERAPY DISCHARGE SUMMARY  Visits from Start of Care: 4  Current functional level related to goals / functional outcomes: Pt had not met any goals to satisfactory levels but was making progress and receiving appropriate education to assist him to address issues related to dx of multiple sclerosis.  Pt had at least 6 no-show/cancellations in less than 24 hours with appts scheduled week to week at end of episode of care and no-show 08/27/23 with no further contact from patient to reschedule any further appts.   Remaining deficits: Pt has various functional deficits with strength, coordination, vision, and memory due to dx of multiple sclerosis.   Education / Equipment: Pt has Lawyer and education to begin self-management. See tx notes for more details.   Patient discharged at this time to resolve this episode of care without max benefit received from outpatient occupational therapy. Pt will need a new referral to resume outpt therapy in the future.     Victorino Sparrow, OT 01/16/2024, 3:02 PM  [facility] [address] [place], [place], [address] Phone: [phone]   Fax:  [phone]

## 2024-04-14 ENCOUNTER — Encounter: Payer: Self-pay | Admitting: Neurology

## 2024-04-14 ENCOUNTER — Ambulatory Visit (INDEPENDENT_AMBULATORY_CARE_PROVIDER_SITE_OTHER): Payer: Medicaid Other | Admitting: Neurology

## 2024-04-14 VITALS — BP 115/73 | HR 74 | Ht 69.0 in | Wt 142.5 lb

## 2024-04-14 DIAGNOSIS — Z79899 Other long term (current) drug therapy: Secondary | ICD-10-CM | POA: Diagnosis not present

## 2024-04-14 DIAGNOSIS — R269 Unspecified abnormalities of gait and mobility: Secondary | ICD-10-CM

## 2024-04-14 DIAGNOSIS — G35 Multiple sclerosis: Secondary | ICD-10-CM | POA: Diagnosis not present

## 2024-04-14 DIAGNOSIS — R208 Other disturbances of skin sensation: Secondary | ICD-10-CM | POA: Diagnosis not present

## 2024-04-14 NOTE — Progress Notes (Signed)
 [facility]  PATIENT: Calvin Ward DOB: 2001-03-14  REFERRING DOCTOR OR PCP: Calvin Dart, NP SOURCE: Patient, extensive notes from hospitalization plan 2024) and emergency room visit 2022, imaging and lab reports, multiple MRI images personally reviewed and reviewed again with the patient  _________________________________   HISTORICAL  CHIEF COMPLAINT:  Chief Complaint  Patient presents with   Follow-up    Pt in room 10. Alone.Here for MS follow up,  Pt reports doing well, no concern. On Kesimpta.    HISTORY OF PRESENT ILLNESS:  Calvin Ward is a 23 y.o. man with relapsing remitting MS  Update 04/14/2024 He started Kesimpta after the last visit and has tolerated it well  He feels gait is doing well.  No issues with balance or stairs.  Gait is doing well and he keeps up with others.   Legs are strong.   The left sided numbness resolved.     Vision is better but OS mildly blurry.    His urinary urgency has also resolved.     Cognitively, he reports some word finding issues.    He feels more tired.   He has sleep onset insomnia >  Sleep maintenance.   Trazodone  had not helped much.    He wakes up 1-2 times for nocturia and a couple times when he just wakes up.     Mood is better with less irritability.  He is working in Teaching laboratory technician, Field seismologist   MS History: [name] presented to the emergency room on 05/17/2023 with a 2-week history of right-sided weakness, numbness and blurred vision (unclear if monocular or binocular). These symptoms developed overnight as he woke up with this in early June.   The visual symptoms improved by the time he went to the emergency room the other symptoms persisted.  Imaging studies were consistent with multiple sclerosis and a large focus in the left hemisphere enhanced after contrast with a tumefactive appearance.  He was admitted and received 5 days IV Solu-Medrol /high-dose prednisone .  Right sided symptoms  were unchanged at discharge but better the next week  In retrospect, in July 2022, he presented with left body numbness lasting a couple weeks that began with a left postauricular headache.  At the time he also had some weakness and pain of the left hand.  Imaging studies were read as normal though I see a couple foci in the brain and at the cervicomedullary junction at that time.   He was working at the time at [employer] and took a few days.     Imaging: MRI of the head 05/17/2023 shows T2 hyperintense foci at the cervicomedullary junction to the left, left cerebellar hemisphere, in the posterior limbs of the internal capsules and in the periventricular, juxtacortical and deep white matter of both cerebral hemispheres.  A large focus in the posterior left frontal lobe enhances after contrast.  MRI of the cervical spine 05/17/2023 shows a focus at the cervicomedullary junction towards the left (sagittal views only) and within the spinal cord posteriorly adjacent to C2-C3 and possibly to the left adjacent to C6-C7 (seen only on the axial gradient echo images).  No significant degenerative change.  Normal enhancement pattern  MRI scan of the head 06/17/2021 showed a small T2/FLAIR hyperintense focus in the periventricular white matter of the right temporal-occipital lobe, right middle cerebellar peduncle/cerebellum and a subtle focus to the left at the cervicomedullary junction.  It was otherwise normal.  MRI of the cervical spine 06/17/2021 showed the focus  at the cervicomedullary junction towards the left    Laboratory: June 2024: HIV varicella-zoster IgG, acute hepatitis panel.  TB test CBC with differential and Paddock function panel normal, negative or noncontributory  REVIEW OF SYSTEMS: Constitutional: No fevers, chills, sweats, or change in appetite Eyes: No visual changes, double vision, eye pain Ear, nose and throat: No hearing loss, ear pain, nasal congestion, sore throat Cardiovascular: No chest  pain, palpitations Respiratory:  No shortness of breath at rest or with exertion.   No wheezes GastrointestinaI: No nausea, vomiting, diarrhea, abdominal pain, fecal incontinence Genitourinary:  No dysuria, urinary retention or frequency.  No nocturia. Musculoskeletal:  No neck pain, back pain Integumentary: No rash, pruritus, skin lesions Neurological: as above Psychiatric: No depression at this time.  No anxiety Endocrine: No palpitations, diaphoresis, change in appetite, change in weigh or increased thirst Hematologic/Lymphatic:  No anemia, purpura, petechiae. Allergic/Immunologic: No itchy/runny eyes, nasal congestion, recent allergic reactions, rashes  ALLERGIES: Allergies  Allergen Reactions   Shellfish Allergy     HOME MEDICATIONS:  Current Outpatient Medications:    KESIMPTA 20 MG/0.4ML SOAJ, Inject into the skin., Disp: , Rfl:    gabapentin  (NEURONTIN ) 300 MG capsule, One po qAM, one po qPM and two po qHS (Patient not taking: Reported on 04/14/2024), Disp: 120 capsule, Rfl: 5   traZODone  (DESYREL ) 50 MG tablet, Take 1 tablet (50 mg total) by mouth at bedtime. (Patient not taking: Reported on 04/14/2024), Disp: 30 tablet, Rfl: 3  PAST MEDICAL HISTORY: Past Medical History:  Diagnosis Date   Asthma    MS (multiple sclerosis) (HCC)     PAST SURGICAL HISTORY: History reviewed. No pertinent surgical history.  FAMILY HISTORY: Family History  Problem Relation Age of Onset   Multiple sclerosis Neg Hx     SOCIAL HISTORY: Social History   Socioeconomic History   Marital status: Single    Spouse name: Not on file   Number of children: Not on file   Years of education: Not on file   Highest education level: Not on file  Occupational History   Not on file  Tobacco Use   Smoking status: Some Days    Current packs/day: 0.25    Average packs/day: 0.3 packs/day    Types: Cigarettes    Start date: 04/01/2024   Smokeless tobacco: Never  Vaping Use   Vaping status: Never  Used  Substance and Sexual Activity   Alcohol use: Never   Drug use: Never   Sexual activity: Not on file  Other Topics Concern   Not on file  Social History Narrative   Right Handed   Social Drivers of Health   Financial Resource Strain: Not on file  Food Insecurity: No Food Insecurity (06/15/2023)   Hunger Vital Sign    Worried About Running Out of Food in the Last Year: Never true    Ran Out of Food in the Last Year: Never true  Transportation Needs: No Transportation Needs (06/15/2023)   PRAPARE - Administrator, Civil Service (Medical): No    Lack of Transportation (Non-Medical): No  Physical Activity: Not on file  Stress: Not on file  Social Connections: Not on file  Intimate Partner Violence: Not At Risk (06/15/2023)   Humiliation, Afraid, Rape, and Kick questionnaire    Fear of Current or Ex-Partner: No    Emotionally Abused: No    Physically Abused: No    Sexually Abused: No       PHYSICAL EXAM  Vitals:  04/14/24 1523  BP: 115/73  Pulse: 74  SpO2: 96%  Weight: 142 lb 8 oz (64.6 kg)  Height: 5\' 9"  (1.753 m)    Body mass index is 21.04 kg/m.   General: The patient is well-developed and well-nourished and in no acute distress  HEENT:  Head is [place]/AT.  Sclera are anicteric.     Skin: Extremities are without rash or  edema.    Neurologic Exam  Mental status: The patient is alert and oriented x 3 at the time of the examination. The patient has apparent normal recent and remote memory, with an apparently normal attention span and concentration ability.   Speech is normal.  Cranial nerves: Extraocular movements are full.  Facial strength and sensation was normal.. No obvious hearing deficits are noted.  Motor:  Muscle bulk is normal.   Tone is slightly increased in the left arm relative to the right. Strength is  5 / 5 in all 4 extremities.   Sensory: Sensory testing is intact to pinprick, soft touch sensation on the right but he has reduced  touch, temperature and vibration sensation in the left  arm more evident than the leg.   He reported more symmetric vibration  Coordination: Cerebellar testing reveals good finger-nose-finger and heel-to-shin bilaterally.  Gait and station: Station is normal.   Gait is near normal but the tandem gait is mildlywide. Romberg is negative.   Reflexes: Deep tendon reflexes are symmetric and normal in the arms.  There is spread at the knees (3+).  No ankle clonus.Aaron Aas       DIAGNOSTIC DATA (LABS, IMAGING, TESTING) - I reviewed patient records, labs, notes, testing and imaging myself where available.  Lab Results  Component Value Date   WBC 10.7 (H) 06/16/2023   HGB 13.7 06/16/2023   HCT 37.2 (L) 06/16/2023   MCV 84.4 06/16/2023   PLT 439 (H) 06/16/2023      Component Value Date/Time   NA 134 (L) 06/16/2023 0210   K 3.6 06/16/2023 0210   CL 102 06/16/2023 0210   CO2 24 06/16/2023 0210   GLUCOSE 125 (H) 06/16/2023 0210   BUN 9 06/16/2023 0210   CREATININE 0.88 06/16/2023 0210   CALCIUM 9.2 06/16/2023 0210   PROT 7.9 06/14/2023 1851   ALBUMIN 4.6 06/14/2023 1851   AST 19 06/14/2023 1851   ALT 25 06/14/2023 1851   ALKPHOS 67 06/14/2023 1851   BILITOT 1.5 (H) 06/14/2023 1851   GFRNONAA >60 06/16/2023 0210       ASSESSMENT AND PLAN  Multiple sclerosis (HCC) - Plan: MR BRAIN W WO CONTRAST, CBC with Differential/Platelet, IgG, IgA, IgM  High risk medication use - Plan: CBC with Differential/Platelet, IgG, IgA, IgM  Gait disturbance  Dysesthesia   Continue Kesimpta.  Check blood work.  Check MRI brain to determine if breakthrough is occurring Insomnia and dysesthesias are better so he will remain off the gabapentin  and trazodone .  With active and exercise as tolerated. Return in 6 months or sooner if there are new or worsening neurologic symptoms   Cheyanne Lamison A. Godwin Lat, MD, [facility] 04/14/2024, 3:45 PM Certified in Neurology, Clinical Neurophysiology, Sleep Medicine and  Neuroimaging  [facility] [address] [place], [place] [address] [phone]

## 2024-04-15 ENCOUNTER — Ambulatory Visit: Payer: Self-pay | Admitting: Neurology

## 2024-04-15 ENCOUNTER — Telehealth: Payer: Self-pay | Admitting: Neurology

## 2024-04-15 LAB — CBC WITH DIFFERENTIAL/PLATELET
Basophils Absolute: 0.1 10*3/uL (ref 0.0–0.2)
Basos: 2 %
EOS (ABSOLUTE): 0.4 10*3/uL (ref 0.0–0.4)
Eos: 10 %
Hematocrit: 47.3 % (ref 37.5–51.0)
Hemoglobin: 16 g/dL (ref 13.0–17.7)
Immature Grans (Abs): 0 10*3/uL (ref 0.0–0.1)
Immature Granulocytes: 0 %
Lymphocytes Absolute: 1.6 10*3/uL (ref 0.7–3.1)
Lymphs: 40 %
MCH: 31.6 pg (ref 26.6–33.0)
MCHC: 33.8 g/dL (ref 31.5–35.7)
MCV: 94 fL (ref 79–97)
Monocytes Absolute: 0.3 10*3/uL (ref 0.1–0.9)
Monocytes: 8 %
Neutrophils Absolute: 1.6 10*3/uL (ref 1.4–7.0)
Neutrophils: 40 %
Platelets: 341 10*3/uL (ref 150–450)
RBC: 5.06 x10E6/uL (ref 4.14–5.80)
RDW: 12.1 % (ref 11.6–15.4)
WBC: 3.9 10*3/uL (ref 3.4–10.8)

## 2024-04-15 LAB — IGG, IGA, IGM
IgA/Immunoglobulin A, Serum: 178 mg/dL (ref 90–386)
IgG (Immunoglobin G), Serum: 1036 mg/dL (ref 603–1613)
IgM (Immunoglobulin M), Srm: 74 mg/dL (ref 20–172)

## 2024-04-15 NOTE — Telephone Encounter (Signed)
 [employer] Siegfried Dress: [id number] exp. 04/15/24-06/14/24 sent to GI [phone]

## 2024-05-18 ENCOUNTER — Other Ambulatory Visit: Payer: Self-pay | Admitting: *Deleted

## 2024-05-18 MED ORDER — KESIMPTA 20 MG/0.4ML ~~LOC~~ SOAJ: 20.0000 mg | SUBCUTANEOUS | 6 refills | Status: DC

## 2024-05-18 NOTE — Telephone Encounter (Signed)
 Last seen on 04/14/24 Follow up scheduled on 11/03/24

## 2024-05-25 ENCOUNTER — Other Ambulatory Visit (HOSPITAL_COMMUNITY): Payer: Self-pay

## 2024-05-25 ENCOUNTER — Telehealth: Payer: Self-pay | Admitting: *Deleted

## 2024-05-25 NOTE — Telephone Encounter (Signed)
 Received fax from covermymeds that PA Kesimpta needed. Key: [id number]

## 2024-05-27 ENCOUNTER — Telehealth: Payer: Self-pay

## 2024-05-27 ENCOUNTER — Other Ambulatory Visit (HOSPITAL_COMMUNITY): Payer: Self-pay

## 2024-05-27 NOTE — Telephone Encounter (Signed)
 Pharmacy Patient Advocate Encounter   Received notification from Physician's Office that prior authorization for Kesimpta 20MG /0.4ML auto-injectors is required/requested.   Insurance verification completed.   The patient is insured through [facility] [place] .   Per test claim: The current 30 day co-pay is, $4.00.  No PA needed at this time. This test claim was processed through [facility]- copay amounts may vary at other pharmacies due to pharmacy/plan contracts, or as the patient moves through the different stages of their insurance plan.    There was another PA in [facility] that was submitted and approved.

## 2024-05-29 ENCOUNTER — Other Ambulatory Visit

## 2024-06-02 ENCOUNTER — Inpatient Hospital Stay: Admission: RE | Admit: 2024-06-02 | Source: Ambulatory Visit

## 2024-06-11 ENCOUNTER — Encounter: Payer: Self-pay | Admitting: Neurology

## 2024-06-28 ENCOUNTER — Inpatient Hospital Stay: Admission: RE | Admit: 2024-06-28 | Source: Ambulatory Visit

## 2024-11-03 ENCOUNTER — Encounter: Payer: Self-pay | Admitting: Neurology

## 2024-11-03 ENCOUNTER — Ambulatory Visit: Admitting: Neurology

## 2024-11-03 VITALS — BP 126/79 | HR 57 | Ht 68.0 in | Wt 150.5 lb

## 2024-11-03 DIAGNOSIS — R269 Unspecified abnormalities of gait and mobility: Secondary | ICD-10-CM | POA: Diagnosis not present

## 2024-11-03 DIAGNOSIS — G35D Multiple sclerosis, unspecified: Secondary | ICD-10-CM | POA: Diagnosis not present

## 2024-11-03 DIAGNOSIS — E559 Vitamin D deficiency, unspecified: Secondary | ICD-10-CM

## 2024-11-03 DIAGNOSIS — R200 Anesthesia of skin: Secondary | ICD-10-CM | POA: Diagnosis not present

## 2024-11-03 DIAGNOSIS — H539 Unspecified visual disturbance: Secondary | ICD-10-CM

## 2024-11-03 DIAGNOSIS — Z79899 Other long term (current) drug therapy: Secondary | ICD-10-CM | POA: Diagnosis not present

## 2024-11-03 NOTE — Progress Notes (Signed)
 [facility]  PATIENT: Calvin Ward DOB: 01-05-2001  REFERRING DOCTOR OR PCP: Jorene Alamin, NP SOURCE: Patient, extensive notes from hospitalization plan 2024) and emergency room visit 22, imaging and lab reports, multiple MRI images personally reviewed and reviewed again with the patient  _________________________________   HISTORICAL  CHIEF COMPLAINT:  Chief Complaint  Patient presents with   Multiple Sclerosis    Rm10, alone, Ms follow up    HISTORY OF PRESENT ILLNESS:  Calvin Ward is a 23 y.o. man with relapsing remitting MS  Update 11/03/2024 He started Kesimpta  after the last visit and has tolerated it well.   04/2024 the IgM/IgG and CBC/D were fine  Gait and balance are doing well.  No issues with balance an seldom uses the rails on the stairs.  He keeps up with others on long walks.   No numbness or weakness in the arms and legs.   The left sided numbness resolved.     No clumsness or ataxia.     Vision is better but OS mildly blurry.   Colors are symmetric, however.     Bladder function is fine.   He denies much fatigue.   Cognitively, he reports some word finding issues.   Insomnia is better.     He no longer has nocturia.  Mood is better.  He is working in teaching laboratory technician, field seismologist.      MS History: Calvin Ward presented to the emergency room on 05/17/2023 with a 2-week history of right-sided weakness, numbness and blurred vision (unclear if monocular or binocular). These symptoms developed overnight as he woke up with this in early June.   The visual symptoms improved by the time he went to the emergency room the other symptoms persisted.  Imaging studies were consistent with multiple sclerosis and a large focus in the left hemisphere enhanced after contrast with a tumefactive appearance.  He was admitted and received 5 days IV Solu-Medrol /high-dose prednisone .  Right sided symptoms were unchanged at discharge but better the next  week  In retrospect, in July 2022, he presented with left body numbness lasting a couple weeks that began with a left postauricular headache.  At the time he also had some weakness and pain of the left hand.  Imaging studies were read as normal though I see a couple foci in the brain and at the cervicomedullary junction at that time.   He was working at the time at [employer] and took a few days.       Kesimpta  was started mid 2024   Imaging: MRI of the head 05/17/2023 shows T2 hyperintense foci at the cervicomedullary junction to the left, left cerebellar hemisphere, in the posterior limbs of the internal capsules and in the periventricular, juxtacortical and deep white matter of both cerebral hemispheres.  A large focus in the posterior left frontal lobe enhances after contrast.  MRI of the cervical spine 05/17/2023 shows a focus at the cervicomedullary junction towards the left (sagittal views only) and within the spinal cord posteriorly adjacent to C2-C3 and possibly to the left adjacent to C6-C7 (seen only on the axial gradient echo images).  No significant degenerative change.  Normal enhancement pattern  MRI scan of the head 06/17/2021 showed a small T2/FLAIR hyperintense focus in the periventricular white matter of the right temporal-occipital lobe, right middle cerebellar peduncle/cerebellum and a subtle focus to the left at the cervicomedullary junction.  It was otherwise normal.  MRI of the cervical spine 06/17/2021 showed the focus  at the cervicomedullary junction towards the left    Laboratory: June 2024: HIV varicella-zoster IgG, acute hepatitis panel.  TB test CBC with differential and [place] function panel normal, negative or noncontributory  REVIEW OF SYSTEMS: Constitutional: No fevers, chills, sweats, or change in appetite Eyes: No visual changes, double vision, eye pain Ear, nose and throat: No hearing loss, ear pain, nasal congestion, sore throat Cardiovascular: No chest pain,  palpitations Respiratory:  No shortness of breath at rest or with exertion.   No wheezes GastrointestinaI: No nausea, vomiting, diarrhea, abdominal pain, fecal incontinence Genitourinary:  No dysuria, urinary retention or frequency.  No nocturia. Musculoskeletal:  No neck pain, back pain Integumentary: No rash, pruritus, skin lesions Neurological: as above Psychiatric: No depression at this time.  No anxiety Endocrine: No palpitations, diaphoresis, change in appetite, change in weigh or increased thirst Hematologic/Lymphatic:  No anemia, purpura, petechiae. Allergic/Immunologic: No itchy/runny eyes, nasal congestion, recent allergic reactions, rashes  ALLERGIES: Allergies  Allergen Reactions   Shellfish Allergy    Shellfish Protein-Containing Drug Products     Other Reaction(s): Unknown    HOME MEDICATIONS:  Current Outpatient Medications:    gabapentin  (NEURONTIN ) 300 MG capsule, One po qAM, one po qPM and two po qHS, Disp: 120 capsule, Rfl: 5   KESIMPTA  20 MG/0.4ML SOAJ, Inject 20 mg into the skin every 30 (thirty) days., Disp: 0.4 mL, Rfl: 6   traZODone  (DESYREL ) 50 MG tablet, Take 1 tablet (50 mg total) by mouth at bedtime., Disp: 30 tablet, Rfl: 3  PAST MEDICAL HISTORY: Past Medical History:  Diagnosis Date   Asthma    MS (multiple sclerosis)     PAST SURGICAL HISTORY: History reviewed. No pertinent surgical history.  FAMILY HISTORY: Family History  Problem Relation Age of Onset   Multiple sclerosis Neg Hx     SOCIAL HISTORY: Social History   Socioeconomic History   Marital status: Single    Spouse name: Not on file   Number of children: Not on file   Years of education: Not on file   Highest education level: Not on file  Occupational History   Not on file  Tobacco Use   Smoking status: Some Days    Current packs/day: 0.25    Average packs/day: 0.3 packs/day for 0.6 years (0.1 ttl pk-yrs)    Types: Cigarettes    Start date: 04/01/2024   Smokeless  tobacco: Never  Vaping Use   Vaping status: Never Used  Substance and Sexual Activity   Alcohol use: Never   Drug use: Never   Sexual activity: Not on file  Other Topics Concern   Not on file  Social History Narrative   Right Handed   Social Drivers of Health   Financial Resource Strain: Not on file  Food Insecurity: No Food Insecurity (06/15/2023)   Hunger Vital Sign    Worried About Running Out of Food in the Last Year: Never true    Ran Out of Food in the Last Year: Never true  Transportation Needs: No Transportation Needs (06/15/2023)   PRAPARE - Administrator, Civil Service (Medical): No    Lack of Transportation (Non-Medical): No  Physical Activity: Not on file  Stress: Not on file  Social Connections: Not on file  Intimate Partner Violence: Not At Risk (06/15/2023)   Humiliation, Afraid, Rape, and Kick questionnaire    Fear of Current or Ex-Partner: No    Emotionally Abused: No    Physically Abused: No    Sexually  Abused: No       PHYSICAL EXAM  Vitals:   11/03/24 1525  BP: 126/79  Pulse: (!) 57  Weight: 150 lb 8 oz (68.3 kg)  Height: 5' 8 (1.727 m)    Body mass index is 22.88 kg/m.   General: The patient is well-developed and well-nourished and in no acute distress  HEENT:  Head is [place]/AT.  Sclera are anicteric.     Skin: Extremities are without rash or  edema.    Neurologic Exam  Mental status: The patient is alert and oriented x 3 at the time of the examination. The patient has apparent normal recent and remote memory, with an apparently normal attention span and concentration ability.   Speech is normal.  Cranial nerves: Extraocular movements are full.  Facial strength and sensation was normal.. No obvious hearing deficits are noted.  Motor:  Muscle bulk is normal.   Tone is slightly increased in the left arm relative to the right. Strength is  5 / 5 in all 4 extremities.   Sensory: He had symmetric sensation to touch and vibration  in the arms and legs  Coordination: Cerebellar testing reveals good finger-nose-finger and heel-to-shin bilaterally.  Gait and station: Station is normal.   The gait was normal.  Tandem gait was slightly wide.. Romberg is negative.   Reflexes: Deep tendon reflexes are symmetric and normal in the arms.  There is spread at the knees (3+).  No ankle clonus.SABRA       DIAGNOSTIC DATA (LABS, IMAGING, TESTING) - I reviewed patient records, labs, notes, testing and imaging myself where available.  Lab Results  Component Value Date   WBC 3.9 04/14/2024   HGB 16.0 04/14/2024   HCT 47.3 04/14/2024   MCV 94 04/14/2024   PLT 341 04/14/2024      Component Value Date/Time   NA 134 (L) 06/16/2023 0210   K 3.6 06/16/2023 0210   CL 102 06/16/2023 0210   CO2 24 06/16/2023 0210   GLUCOSE 125 (H) 06/16/2023 0210   BUN 9 06/16/2023 0210   CREATININE 0.88 06/16/2023 0210   CALCIUM 9.2 06/16/2023 0210   PROT 7.9 06/14/2023 1851   ALBUMIN 4.6 06/14/2023 1851   AST 19 06/14/2023 1851   ALT 25 06/14/2023 1851   ALKPHOS 67 06/14/2023 1851   BILITOT 1.5 (H) 06/14/2023 1851   GFRNONAA >60 06/16/2023 0210       ASSESSMENT AND PLAN  Multiple sclerosis - Plan: MR BRAIN WO CONTRAST  High risk medication use  Gait disturbance  Numbness  Vitamin D  deficiency  Vision disturbance   Continue Kesimpta .    Check MRI brain to determine if breakthrough is occurring.  Recheck labs at next visit   Continue Vit D Stay active and exercise as tolerated. Return in 6 months or sooner if there are new or worsening neurologic symptoms   Jasmen Emrich A. Vear, MD, [facility] 11/03/2024, 4:05 PM Certified in Neurology, Clinical Neurophysiology, Sleep Medicine and Neuroimaging  [facility] [address] [place], [place] [address] [phone]

## 2024-11-11 ENCOUNTER — Encounter: Payer: Self-pay | Admitting: Neurology

## 2024-11-18 ENCOUNTER — Inpatient Hospital Stay: Admission: RE | Admit: 2024-11-18 | Discharge: 2024-11-18 | Attending: Neurology | Admitting: Neurology

## 2024-11-18 DIAGNOSIS — G35D Multiple sclerosis, unspecified: Secondary | ICD-10-CM

## 2024-11-19 ENCOUNTER — Ambulatory Visit: Payer: Self-pay | Admitting: Neurology

## 2024-12-21 ENCOUNTER — Other Ambulatory Visit: Payer: Self-pay | Admitting: Neurology

## 2024-12-22 NOTE — Telephone Encounter (Signed)
 Last seen on 11/03/24 No 6 month follow up scheduled    Dispensed Days Supply Quantity Provider Pharmacy  KESIMPTA  20 MG/0.4 ML PEN 12/07/2024 30 0.4 mL Sater, Charlie LABOR, MD [facility] [email or web address]
